# Patient Record
Sex: Male | Born: 1980
Health system: Southern US, Community
[De-identification: ages and names within clinical notes are randomized; demographics above are authoritative.]

## PROBLEM LIST (undated history)

## (undated) DIAGNOSIS — J449 Chronic obstructive pulmonary disease, unspecified: Secondary | ICD-10-CM

## (undated) DIAGNOSIS — J45909 Unspecified asthma, uncomplicated: Secondary | ICD-10-CM

## (undated) DIAGNOSIS — M545 Low back pain, unspecified: Secondary | ICD-10-CM

## (undated) DIAGNOSIS — B1920 Unspecified viral hepatitis C without hepatic coma: Secondary | ICD-10-CM

## (undated) DIAGNOSIS — F1990 Other psychoactive substance use, unspecified, uncomplicated: Secondary | ICD-10-CM

## (undated) DIAGNOSIS — K219 Gastro-esophageal reflux disease without esophagitis: Secondary | ICD-10-CM

## (undated) DIAGNOSIS — M1990 Unspecified osteoarthritis, unspecified site: Secondary | ICD-10-CM

## (undated) DIAGNOSIS — I509 Heart failure, unspecified: Secondary | ICD-10-CM

## (undated) DIAGNOSIS — G8929 Other chronic pain: Secondary | ICD-10-CM

## (undated) HISTORY — PX: TYMPANOSTOMY TUBE PLACEMENT: SHX32

## (undated) HISTORY — PX: HERNIA REPAIR: SHX51

## (undated) HISTORY — PX: ABDOMINAL HERNIA REPAIR: SHX539

## (undated) HISTORY — PX: TONSILLECTOMY AND ADENOIDECTOMY: SUR1326

---

## 2005-01-11 ENCOUNTER — Emergency Department: Payer: Self-pay | Admitting: Emergency Medicine

## 2007-10-10 ENCOUNTER — Emergency Department: Payer: Self-pay | Admitting: Emergency Medicine

## 2012-09-02 ENCOUNTER — Emergency Department: Payer: Self-pay | Admitting: Emergency Medicine

## 2016-01-11 ENCOUNTER — Encounter: Payer: Self-pay | Admitting: Emergency Medicine

## 2016-01-11 ENCOUNTER — Emergency Department
Admission: EM | Admit: 2016-01-11 | Discharge: 2016-01-11 | Disposition: A | Discharge status: Home / Self Care | Payer: Self-pay | Attending: Emergency Medicine | Admitting: Emergency Medicine

## 2016-01-11 DIAGNOSIS — F17210 Nicotine dependence, cigarettes, uncomplicated: Secondary | ICD-10-CM | POA: Insufficient documentation

## 2016-01-11 DIAGNOSIS — Y9389 Activity, other specified: Secondary | ICD-10-CM | POA: Insufficient documentation

## 2016-01-11 DIAGNOSIS — Y92002 Bathroom of unspecified non-institutional (private) residence single-family (private) house as the place of occurrence of the external cause: Secondary | ICD-10-CM | POA: Insufficient documentation

## 2016-01-11 DIAGNOSIS — T401X1A Poisoning by heroin, accidental (unintentional), initial encounter: Secondary | ICD-10-CM | POA: Insufficient documentation

## 2016-01-11 DIAGNOSIS — R4020 Unspecified coma: Secondary | ICD-10-CM | POA: Insufficient documentation

## 2016-01-11 DIAGNOSIS — T413X1A Poisoning by local anesthetics, accidental (unintentional), initial encounter: Secondary | ICD-10-CM | POA: Insufficient documentation

## 2016-01-11 DIAGNOSIS — Y998 Other external cause status: Secondary | ICD-10-CM | POA: Insufficient documentation

## 2016-01-11 LAB — CBC
HCT: 40.9 % (ref 40.0–52.0)
Hemoglobin: 13.8 g/dL (ref 13.0–18.0)
MCH: 30.1 pg (ref 26.0–34.0)
MCHC: 33.6 g/dL (ref 32.0–36.0)
MCV: 89.6 fL (ref 80.0–100.0)
Platelets: 202 10*3/uL (ref 150–440)
RBC: 4.57 MIL/uL (ref 4.40–5.90)
RDW: 14.1 % (ref 11.5–14.5)
WBC: 7.1 10*3/uL (ref 3.8–10.6)

## 2016-01-11 LAB — COMPREHENSIVE METABOLIC PANEL
ALBUMIN: 3.7 g/dL (ref 3.5–5.0)
ALT: 231 U/L — ABNORMAL HIGH (ref 17–63)
ANION GAP: 4 — AB (ref 5–15)
AST: 184 U/L — ABNORMAL HIGH (ref 15–41)
Alkaline Phosphatase: 96 U/L (ref 38–126)
BILIRUBIN TOTAL: 0.8 mg/dL (ref 0.3–1.2)
BUN: 9 mg/dL (ref 6–20)
CO2: 33 mmol/L — AB (ref 22–32)
Calcium: 8.4 mg/dL — ABNORMAL LOW (ref 8.9–10.3)
Chloride: 100 mmol/L — ABNORMAL LOW (ref 101–111)
Creatinine, Ser: 0.97 mg/dL (ref 0.61–1.24)
GFR calc Af Amer: >60 mL/min (ref >60)
GFR calc non Af Amer: >60 mL/min (ref >60)
GLUCOSE: 128 mg/dL — AB (ref 65–99)
POTASSIUM: 3.9 mmol/L (ref 3.5–5.1)
SODIUM: 137 mmol/L (ref 135–145)
TOTAL PROTEIN: 7.3 g/dL (ref 6.5–8.1)

## 2016-01-11 LAB — ACETAMINOPHEN LEVEL

## 2016-01-11 LAB — ETHANOL: Alcohol, Ethyl (B): <5 mg/dL (ref <5)

## 2016-01-11 LAB — SALICYLATE LEVEL: Salicylate Lvl: <4 mg/dL (ref 2.8–30.0)

## 2016-01-11 NOTE — [facility] Counselor (Signed)
TTS provided pt with SA Tx referrals to [facility]. Also, provided pt with a list of local substance abuse Tx facilities. Pt denied SI/HI.

## 2016-01-11 NOTE — ED Notes (Addendum)
BIB EMS from work [employer] workers found pt in the bathroom unresponsive. Syringe found near pt. Pt admits to using cocaine and heroin. Became more responsive with EMS enroute. Alert and oriented at this time. CBG 128. Tacycardia 110's. Pt states the overdoes was unintentional Denies SI/HI.

## 2016-01-11 NOTE — Discharge Instructions (Signed)
Accidental Overdose °A drug overdose occurs when a chemical substance (drug or medication) is used in amounts large enough to overcome a person. This may result in severe illness or death. This is a type of poisoning. Accidental overdoses of medications or other substances come from a variety of reasons. When this happens accidentally, it is often because the person taking the substance does not know enough about what they have taken. Drugs which commonly cause overdose deaths are alcohol, psychotropic medications (medications which affect the mind), pain medications, illegal drugs (street drugs) such as cocaine and heroin, and multiple drugs taken at the same time. It may result from careless behavior (such as over-indulging at a party). Other causes of overdose may include multiple drug use, a lapse in memory, or drug use after a period of no drug use.  °Sometimes overdosing occurs because a person cannot remember if they have taken their medication.  °A common unintentional overdose in young children involves multi-vitamins containing iron. Iron is a part of the hemoglobin molecule in blood. It is used to transport oxygen to living cells. When taken in small amounts, iron allows the body to restock hemoglobin. In large amounts, it causes problems in the body. If this overdose is not treated, it can lead to death. °Never take medicines that show signs of tampering or do not seem quite right. Never take medicines in the dark or in poor lighting. Read the label and check each dose of medicine before you take it. When adults are poisoned, it happens most often through carelessness or lack of information. Taking medicines in the dark or taking medicine prescribed for someone else to treat the same type of problem is a dangerous practice. °SYMPTOMS  °Symptoms of overdose depend on the medication and amount taken. They can vary from over-activity with stimulant over-dosage, to sleepiness from depressants such as  alcohol, narcotics and tranquilizers. Confusion, dizziness, nausea and vomiting may be present. If problems are severe enough coma and death may result. °DIAGNOSIS  °Diagnosis and management are generally straightforward if the drug is known. Otherwise it is more difficult. At times, certain symptoms and signs exhibited by the patient, or blood tests, can reveal the drug in question.  °TREATMENT  °In an emergency department, most patients can be treated with supportive measures. Antidotes may be available if there has been an overdose of opioids or benzodiazepines. A rapid improvement will often occur if this is the cause of overdose. °At home or away from medical care: °· There may be no immediate problems or warning signs in children. °· Not everything works well in all cases of poisoning. °· Take immediate action. Poisons may act quickly. °· If you think someone has swallowed medicine or a household product, and the person is unconscious, having seizures (convulsions), or is not breathing, immediately call for an ambulance. °IF a person is conscious and appears to be doing OK but has swallowed a poison: °· Do not wait to see what effect the poison will have. Immediately call a poison control center (listed in the white pages of your telephone book under "Poison Control" or inside the front cover with other emergency numbers). Some poison control centers have TTY capability for the deaf. Check with your local center if you or someone in your family requires this service. °· Keep the container so you can read the label on the product for ingredients. °· Describe what, when, and how much was taken and the age and condition of the person poisoned.   Inform them if the person is vomiting, choking, drowsy, shows a change in color or temperature of skin, is conscious or unconscious, or is convulsing. °· Do not cause vomiting unless instructed by medical personnel. Do not induce vomiting or force liquids into a person who  is convulsing, unconscious, or very drowsy. °Stay calm and in control.  °· Activated charcoal also is sometimes used in certain types of poisoning and you may wish to add a supply to your emergency medicines. It is available without a prescription. Call a poison control center before using this medication. °PREVENTION  °Thousands of children die every year from unintentional poisoning. This may be from household chemicals, poisoning from carbon monoxide in a car, taking their parent's medications, or simply taking a few iron pills or vitamins with iron. Poisoning comes from unexpected sources. °· Store medicines out of the sight and reach of children, preferably in a locked cabinet. Do not keep medications in a food cabinet. Always store your medicines in a secure place. Get rid of expired medications. °· If you have children living with you or have them as occasional guests, you should have child-resistant caps on your medicine containers. Keep everything out of reach. Child proof your home. °· If you are called to the telephone or to answer the door while you are taking a medicine, take the container with you or put the medicine out of the reach of small children. °· Do not take your medication in front of children. Do not tell your child how good a medication is and how good it is for them. They may get the idea it is more of a treat. °· If you are an adult and have accidentally taken an overdose, you need to consider how this happened and what can be done to prevent it from happening again. If this was from a street drug or alcohol, determine if there is a problem that needs addressing. If you are not sure a problems exists, it is easy to talk to a professional and ask them if they think you have a problem. It is better to handle this problem in this way before it happens again and has a much worse consequence. °  °This information is not intended to replace advice given to you by your health care provider. Make  sure you discuss any questions you have with your health care provider. °  °Document Released: 01/30/2005 Document Revised: 12/07/2014 Document Reviewed: 05/06/2015 °Elsevier Interactive Patient Education ©2016 [employer] ° °Narcotic Overdose °A narcotic overdose is the misuse or overuse of a narcotic drug. A narcotic overdose can make you pass out and stop breathing. If you are not treated right away, this can cause permanent brain damage or stop your heart. Medicine may be given to reverse the effects of an overdose. If so, this medicine may bring on withdrawal symptoms. The symptoms may be abdominal cramps, throwing up (vomiting), sweating, chills, and nervousness. °Injecting narcotics can cause more problems than just an overdose. AIDS, hepatitis, and other very serious infections are transmitted by sharing needles and syringes. If you decide to quit using, there are medicines which can help you through the withdrawal period. Trying to quit all at once on your own can be uncomfortable, but not life-threatening. Call your caregiver, Narcotics Anonymous, or any drug and alcohol treatment program for further help.  °  °This information is not intended to replace advice given to you by your health care provider. Make sure you discuss any questions   you have with your health care provider. °  °Document Released: 12/24/2004 Document Revised: 12/07/2014 Document Reviewed: 05/08/2015 °Elsevier Interactive Patient Education ©2016 [employer] ° °

## 2016-01-11 NOTE — ED Provider Notes (Signed)
[facility] Emergency Department Provider Note  Time seen: 10:25 AM  I have reviewed the triage vital signs and the nursing notes.   HISTORY  Chief Complaint Drug Overdose    HPI Aaron Young is a 35 y.o. male with no past medical history who presents the emergency department found unresponsive. According to the patient he states he was at work this morning, he went to the bathroom and injected cocaine and heroin. Patient was found by a coworker unresponsive on the bathroom floor. Patient brought to the emergency department. On the way to the emergency department the patient became awake, alert, upon arrival patient is oriented. Patient has not received Narcan. 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 States he has tried to detox himself with Suboxone. He is not interested in inpatient detox, but states he would like some information on outpatient detox. Denies any intentional harm. Denies any other substances.  No past medical history on file.  There are no active problems to display for this patient.   No past surgical history on file.  No current outpatient prescriptions on file.  Allergies Review of patient's allergies indicates no known allergies.  No family history on file.  Social History Social History  Substance Use Topics  . Smoking status: Current Every Day Smoker -- 1.00 packs/day    Types: Cigarettes  . Smokeless tobacco: Never Used  . Alcohol Use: No    Review of Systems Constitutional: Negative for fever Cardiovascular: Negative for chest pain. Respiratory: Negative for shortness of breath. Gastrointestinal: Negative for abdominal pain Neurological: Negative for headache 10-point ROS otherwise negative.  ____________________________________________   PHYSICAL EXAM:  VITAL SIGNS: ED Triage Vitals  Enc Vitals Group     BP 01/11/16 1017 156/85 mmHg     Pulse Rate 01/11/16 1017 126   Resp 01/11/16 1017 22     Temp 01/11/16 1017 98.9 F (37.2 C)     Temp Source 01/11/16 1017 Oral     SpO2 01/11/16 1017 91 %     Weight --      Height --      Head Cir --      Peak Flow --      Pain Score 01/11/16 1021 8     Pain Loc --      Pain Edu? --      Excl. in GC? --     Constitutional: Alert and oriented. Well appearing and in no distress. Eyes: Normal exam ENT   Head: Normocephalic and atraumatic.   Mouth/Throat: Mucous membranes are moist. Cardiovascular: Normal rate, regular rhythm. Respiratory: Normal respiratory effort without tachypnea nor retractions. Breath sounds are clear Gastrointestinal: Soft and nontender. No distention. Musculoskeletal: Nontender with normal range of motion in all extremities.  Neurologic:  Normal speech and language. No gross focal neurologic deficits Skin:  Skin is warm, dry and intact.  Psychiatric: Mood and affect are normal. Speech and behavior are normal.  ____________________________________________    INITIAL IMPRESSION / ASSESSMENT AND PLAN / ED COURSE  Pertinent labs & imaging results that were available during my care of the patient were reviewed by me and considered in my medical decision making (see chart for details).  Patient presents the emergency department after accidental heroin overdose. Patient is awake, alert, oriented, no complaints at this time. Patient has not received Narcan. We will check labs, monitor the patient in the emergency department. Patient is not interested in inpatient detox.  She continues to appear well, labs are largely within normal limits besides mildly elevated LFTs which the patient is aware of, states they have been like that for several years. Patient has eaten, is awake alert oriented, no distress has no complaints. We'll discharge home.  ____________________________________________   FINAL CLINICAL IMPRESSION(S) / ED DIAGNOSES  Heroin overdose, accidental   Minna Antis, MD 01/11/16 1328

## 2016-01-11 NOTE — ED Notes (Signed)
Pt sitting up eating lunch. Given phone return call to brother MAC.

## 2016-01-13 ENCOUNTER — Emergency Department
Admission: EM | Admit: 2016-01-13 | Discharge: 2016-01-13 | Disposition: A | Discharge status: Home / Self Care | Payer: Self-pay | Attending: Emergency Medicine | Admitting: Emergency Medicine

## 2016-01-13 ENCOUNTER — Encounter: Payer: Self-pay | Admitting: *Deleted

## 2016-01-13 ENCOUNTER — Emergency Department: Payer: Self-pay

## 2016-01-13 DIAGNOSIS — F17210 Nicotine dependence, cigarettes, uncomplicated: Secondary | ICD-10-CM | POA: Insufficient documentation

## 2016-01-13 DIAGNOSIS — X58XXXA Exposure to other specified factors, initial encounter: Secondary | ICD-10-CM | POA: Insufficient documentation

## 2016-01-13 DIAGNOSIS — S92902A Unspecified fracture of left foot, initial encounter for closed fracture: Secondary | ICD-10-CM

## 2016-01-13 DIAGNOSIS — S92322A Displaced fracture of second metatarsal bone, left foot, initial encounter for closed fracture: Secondary | ICD-10-CM | POA: Insufficient documentation

## 2016-01-13 DIAGNOSIS — Y92008 Other place in unspecified non-institutional (private) residence as the place of occurrence of the external cause: Secondary | ICD-10-CM | POA: Insufficient documentation

## 2016-01-13 DIAGNOSIS — Y998 Other external cause status: Secondary | ICD-10-CM | POA: Insufficient documentation

## 2016-01-13 DIAGNOSIS — Y9339 Activity, other involving climbing, rappelling and jumping off: Secondary | ICD-10-CM | POA: Insufficient documentation

## 2016-01-13 HISTORY — DX: Unspecified asthma, uncomplicated: J45.909

## 2016-01-13 MED ORDER — OXYCODONE-ACETAMINOPHEN 5-325 MG PO TABS
1.0000 | ORAL_TABLET | Freq: Once | ORAL | Status: AC
Start: 2016-01-13 — End: 2016-01-13
  Administered 2016-01-13: 1 via ORAL
  Filled 2016-01-13: qty 1

## 2016-01-13 MED ORDER — IBUPROFEN 600 MG PO TABS: 600.0000 mg | ORAL_TABLET | Freq: Four times a day (QID) | ORAL | Status: DC | PRN

## 2016-01-13 MED ORDER — IBUPROFEN 600 MG PO TABS
600.0000 mg | ORAL_TABLET | Freq: Once | ORAL | Status: AC
  Administered 2016-01-13: 600 mg via ORAL
  Filled 2016-01-13: qty 1

## 2016-01-13 MED ORDER — OXYCODONE-ACETAMINOPHEN 7.5-325 MG PO TABS: 1.0000 | ORAL_TABLET | Freq: Four times a day (QID) | ORAL | Status: DC | PRN

## 2016-01-13 NOTE — Discharge Instructions (Signed)
Ambulate with crutches until evaluation by podiatry.

## 2016-01-13 NOTE — ED Notes (Signed)
Pt states he jumped off his porch last night and heard a "crunch", states left foot pain

## 2016-01-13 NOTE — ED Notes (Signed)
Pt in via triage with complaints of left foot pain; pt reports jumping off porch last night and hearing a "crunch" in left foot when he landed.  Pt reports keeping foot elevated last night but pain/swelling were no better this morning.  Pt ambulatory with crutches

## 2016-01-13 NOTE — ED Provider Notes (Signed)
[facility] Emergency Department Provider Note  ____________________________________________  Time seen: Approximately 1:33 PM  I have reviewed the triage vital signs and the nursing notes.   HISTORY  Chief Complaint Foot Pain    HPI Aaron Young is a 35 y.o. male patient complaining of left foot pain status post jumping off the porch last night. Patient state he heard a crunch and foot when he landed. Patient stated pain in his foot has increased overnight. This also increased swelling. Patient is taking over-the-counter ibuprofen with no relief. Patient rates his pain as a 10 over 10.No other palliative measures taken for this complaint.   Past Medical History  Diagnosis Date  . Asthma     There are no active problems to display for this patient.   History reviewed. No pertinent past surgical history.  No current outpatient prescriptions on file.  Allergies Review of patient's allergies indicates no known allergies.  History reviewed. No pertinent family history.  Social History Social History  Substance Use Topics  . Smoking status: Current Every Day Smoker -- 1.00 packs/day    Types: Cigarettes  . Smokeless tobacco: Never Used  . Alcohol Use: No    Review of Systems Constitutional: No fever/chills Eyes: No visual changes. ENT: No sore throat. Cardiovascular: Denies chest pain. Respiratory: Denies shortness of breath. Gastrointestinal: No abdominal pain.  No nausea, no vomiting.  No diarrhea.  No constipation. Genitourinary: Negative for dysuria. Musculoskeletal: Left foot pain Skin: Negative for rash. Neurological: Negative for headaches, focal weakness or numbness.    ____________________________________________   PHYSICAL EXAM:  VITAL SIGNS: ED Triage Vitals  Enc Vitals Group     BP 01/13/16 1158 150/69 mmHg     Pulse Rate 01/13/16 1158 99     Resp 01/13/16 1158 18     Temp 01/13/16 1158 98.2 F (36.8 C)      Temp Source 01/13/16 1158 Oral     SpO2 01/13/16 1158 95 %     Weight 01/13/16 1158 175 lb (79.379 kg)     Height 01/13/16 1158  (1.778 m)     Head Cir --      Peak Flow --      Pain Score 01/13/16 1159 10     Pain Loc --      Pain Edu? --      Excl. in GC? --     Constitutional: Alert and oriented. Well appearing and in no acute distress. Eyes: Conjunctivae are normal. PERRL. EOMI. Head: Atraumatic. Nose: No congestion/rhinnorhea. Mouth/Throat: Mucous membranes are moist.  Oropharynx non-erythematous. Neck: No stridor. No cervical spine tenderness to palpation. Hematological/Lymphatic/Immunilogical: No cervical lymphadenopathy. Cardiovascular: Normal rate, regular rhythm. Grossly normal heart sounds.  Good peripheral circulation. Respiratory: Normal respiratory effort.  No retractions. Lungs CTAB. Gastrointestinal: Soft and nontender. No distention. No abdominal bruits. No CVA tenderness. Musculoskeletal: Obviously edema but no deformity of the left foot. Tender palpation dorsal aspect of the first and second metatarsal. Patient cannot weight-bear.  Neurologic:  Normal speech and language. No gross focal neurologic deficits are appreciated. No gait instability. Skin:  Skin is warm, dry and intact. No rash noted. Psychiatric: Mood and affect are normal. Speech and behavior are normal.  ____________________________________________   LABS (all labs ordered are listed, but only abnormal results are displayed)  Labs Reviewed - No data to display ____________________________________________  EKG   ____________________________________________  RADIOLOGY I, Joni Reining, personally viewed and evaluated these images (plain radiographs) as part of  my medical decision making, as well as reviewing the written report by the radiologist.  ____________________________________________   PROCEDURES  Procedure(s) performed: None  Critical Care performed:  No  ____________________________________________   INITIAL IMPRESSION / ASSESSMENT AND PLAN / ED COURSE  Pertinent labs & imaging results that were available during my care of the patient were reviewed by me and considered in my medical decision making (see chart for details).  Discussed x-ray findings the patient's chart fracture the first and second metatarsal. Patient given a prescription of ibuprofen and Percocets. Patient advised to ambulate with crutches. Patient advised contact orthopedic department the morning for follow-up for definitive care.. ____________________________________________   FINAL CLINICAL IMPRESSION(S) / ED DIAGNOSES  Final diagnoses:  Foot fracture, left, closed, initial encounter      Joni Reining, PA-C 01/13/16 1340  Rockne Menghini, MD 01/13/16 1529

## 2016-02-07 ENCOUNTER — Ambulatory Visit
Admission: RE | Admit: 2016-02-07 | Discharge: 2016-02-07 | Disposition: A | Discharge status: Home / Self Care | Payer: Self-pay | Source: Ambulatory Visit | Attending: Podiatry | Admitting: Podiatry

## 2016-02-07 ENCOUNTER — Other Ambulatory Visit: Payer: Self-pay | Admitting: Podiatry

## 2016-02-07 DIAGNOSIS — I82402 Acute embolism and thrombosis of unspecified deep veins of left lower extremity: Secondary | ICD-10-CM

## 2016-02-07 DIAGNOSIS — M79605 Pain in left leg: Secondary | ICD-10-CM | POA: Insufficient documentation

## 2017-10-03 ENCOUNTER — Emergency Department
Admission: EM | Admit: 2017-10-03 | Discharge: 2017-10-03 | Disposition: A | Discharge status: Home / Self Care | Payer: Self-pay | Attending: Emergency Medicine | Admitting: Emergency Medicine

## 2017-10-03 DIAGNOSIS — F17210 Nicotine dependence, cigarettes, uncomplicated: Secondary | ICD-10-CM | POA: Insufficient documentation

## 2017-10-03 DIAGNOSIS — L02414 Cutaneous abscess of left upper limb: Secondary | ICD-10-CM | POA: Insufficient documentation

## 2017-10-03 DIAGNOSIS — J45909 Unspecified asthma, uncomplicated: Secondary | ICD-10-CM | POA: Insufficient documentation

## 2017-10-03 DIAGNOSIS — L0291 Cutaneous abscess, unspecified: Secondary | ICD-10-CM

## 2017-10-03 LAB — BASIC METABOLIC PANEL
ANION GAP: 9 (ref 5–15)
BUN: 8 mg/dL (ref 6–20)
CHLORIDE: 100 mmol/L — AB (ref 101–111)
CO2: 28 mmol/L (ref 22–32)
Calcium: 9 mg/dL (ref 8.9–10.3)
Creatinine, Ser: 0.84 mg/dL (ref 0.61–1.24)
GFR calc Af Amer: >60 mL/min (ref >60)
GFR calc non Af Amer: >60 mL/min (ref >60)
Glucose, Bld: 94 mg/dL (ref 65–99)
POTASSIUM: 4.2 mmol/L (ref 3.5–5.1)
SODIUM: 137 mmol/L (ref 135–145)

## 2017-10-03 LAB — CBC
HEMATOCRIT: 42 % (ref 40.0–52.0)
HEMOGLOBIN: 14.3 g/dL (ref 13.0–18.0)
MCH: 30.3 pg (ref 26.0–34.0)
MCHC: 34 g/dL (ref 32.0–36.0)
MCV: 89.1 fL (ref 80.0–100.0)
Platelets: 311 10*3/uL (ref 150–440)
RBC: 4.72 MIL/uL (ref 4.40–5.90)
RDW: 13.5 % (ref 11.5–14.5)
WBC: 6 10*3/uL (ref 3.8–10.6)

## 2017-10-03 MED ORDER — LIDOCAINE-EPINEPHRINE 2 %-1:100000 IJ SOLN
30.0000 mL | Freq: Once | INTRAMUSCULAR | Status: AC
Start: 2017-10-03 — End: 2017-10-03
  Administered 2017-10-03: 30 mL via INTRADERMAL
  Filled 2017-10-03: qty 30

## 2017-10-03 MED ORDER — CLINDAMYCIN HCL 300 MG PO CAPS: 300.0000 mg | ORAL_CAPSULE | Freq: Three times a day (TID) | ORAL | 0 refills | Status: DC

## 2017-10-03 MED ORDER — CLINDAMYCIN HCL 150 MG PO CAPS
300.0000 mg | ORAL_CAPSULE | Freq: Once | ORAL | Status: AC
  Administered 2017-10-03: 300 mg via ORAL
  Filled 2017-10-03: qty 2

## 2017-10-03 NOTE — ED Notes (Signed)
Pt noted to be sleeping while waiting for treatment room; significant other awake beside him

## 2017-10-03 NOTE — ED Notes (Signed)
Pt noted to be sleeping in waiting room while waiting for treatment room

## 2017-10-03 NOTE — ED Notes (Signed)
Pt triaged on paper during downtime  

## 2017-10-03 NOTE — Discharge Instructions (Signed)
You are evaluated for abscess to the left arm, which was open to promote drainage of the infection.  You were started on antibiotic clindamycin, please complete the entire course.  Given the size of the abscess, I would recommend having this rechecked in 2 days, certainly if it is not improving.  Return to the emergency department immediately for any new or worsening rash, redness, pain, or any fever, headache, nausea, or systemic symptoms, or any other symptoms concerning to you.

## 2017-10-03 NOTE — ED Provider Notes (Signed)
[facility] Emergency Department Provider Note ____________________________________________   I have reviewed the triage vital signs and the triage nursing note.  HISTORY  Chief Complaint Abscess   Historian Patient  HPI Aaron Young is a 36 y.o. male no prior history of cellulitis or abscess presents with abscess on his left forearm.  Patient states that about a week ago.  He did have leftover sulfa antibiotic and started taking 3 tablets/day for about 4-5 days before he ran out.  At some point during the last several days the boil had opened and drained some and he tried to express it further.  It has improved quite a bit, but still swollen.  Girlfriend requested he come in for evaluation.  Pain is moderate.  Nothing makes worse or better.   Past Medical History:  Diagnosis Date  . Asthma     There are no active problems to display for this patient.   No past surgical history on file.  Prior to Admission medications   Medication Sig Start Date End Date Taking? Authorizing Provider  clindamycin (CLEOCIN) 300 MG capsule Take 1 capsule (300 mg total) 3 (three) times daily by mouth. 10/03/17   Governor Rooks, MD  ibuprofen (ADVIL,MOTRIN) 600 MG tablet Take 1 tablet (600 mg total) by mouth every 6 (six) hours as needed. 01/13/16   Joni Reining, PA-C  oxyCODONE-acetaminophen (PERCOCET) 7.5-325 MG tablet Take 1 tablet by mouth every 6 (six) hours as needed for severe pain. 01/13/16   Joni Reining, PA-C    No Known Allergies  No family history on file.  Social History Social History   Tobacco Use  . Smoking status: Current Every Day Smoker    Packs/day: 1.00    Types: Cigarettes  . Smokeless tobacco: Never Used  Substance Use Topics  . Alcohol use: No  . Drug use: Yes    Types: Cocaine    Review of Systems  Constitutional: Negative for fever. Eyes: Negative for visual changes. ENT: Negative for sore throat. Cardiovascular:  Negative for chest pain. Respiratory: Negative for shortness of breath. Gastrointestinal: Negative for abdominal pain, vomiting and diarrhea. Genitourinary: Negative for dysuria. Musculoskeletal: Negative for back pain. Skin: Left forearm swelling redness and boil. Neurological: Negative for headache.  ____________________________________________   PHYSICAL EXAM:  VITAL SIGNS: ED Triage Vitals  Enc Vitals Group     BP      Pulse      Resp      Temp      Temp src      SpO2      Weight      Height      Head Circumference      Peak Flow      Pain Score      Pain Loc      Pain Edu?      Excl. in GC?      Constitutional: Alert and oriented. Well appearing and in no distress. HEENT   Head: Normocephalic and atraumatic.      Eyes: Conjunctivae are normal. Pupils equal and round.       Ears:         Nose: No congestion/rhinnorhea.   Mouth/Throat: Mucous membranes are moist.   Neck: No stridor. Cardiovascular/Chest: Strong peripheral pulses.   Respiratory: Normal respiratory effort without tachypnea nor retractions.  Gastrointestinal: No distention. Genitourinary/rectal:Deferred Musculoskeletal: Pain at left forearm where there is an abscess. Neurologic:  Normal speech and language. No gross or focal neurologic  deficits are appreciated. Skin: Left forearm volar surface abscess with pointing and some scabbing, mild surrounding redness. Psychiatric: Mood and affect are normal. Speech and behavior are normal. Patient exhibits appropriate insight and judgment.   ____________________________________________  LABS (pertinent positives/negatives) I, Governor Rooks, MD the attending physician have reviewed the labs noted below.  Labs Reviewed  BASIC METABOLIC PANEL - Abnormal; Notable for the following components:      Result Value   Chloride 100 (*)    All other components within normal limits  AEROBIC CULTURE (SUPERFICIAL SPECIMEN)  CBC     ____________________________________________    EKG I, Governor Rooks, MD, the attending physician have personally viewed and interpreted all ECGs.  None ____________________________________________  RADIOLOGY All Xrays were viewed by me.  Imaging interpreted by Radiologist, and I, Governor Rooks, MD the attending physician have reviewed the radiologist interpretation noted below.  None __________________________________________  PROCEDURES  Procedure(s) performed: INCISION AND DRAINAGE Performed by: Governor Rooks Consent: Verbal consent obtained. Risks and benefits: risks, benefits and alternatives were discussed Type: abscess  Body area: left forearm  Anesthesia: local infiltration  Incision was made with a scalpel.  Local anesthetic: lidocaine 2% with epinephrine  Anesthetic total: 1 ml   Drainage: purulent  Drainage amount: minimal    Patient tolerance: Patient tolerated the procedure well with no immediate complications.     Critical Care performed: None  ____________________________________________  No current facility-administered medications on file prior to encounter.    Current Outpatient Medications on File Prior to Encounter  Medication Sig Dispense Refill  . ibuprofen (ADVIL,MOTRIN) 600 MG tablet Take 1 tablet (600 mg total) by mouth every 6 (six) hours as needed. 30 tablet 0  . oxyCODONE-acetaminophen (PERCOCET) 7.5-325 MG tablet Take 1 tablet by mouth every 6 (six) hours as needed for severe pain. 12 tablet 0    ____________________________________________  ED COURSE / ASSESSMENT AND PLAN  Pertinent labs & imaging results that were available during my care of the patient were reviewed by me and considered in my medical decision making (see chart for details).    Consistent with abscess, small amount of surrounding cellulitis.  I am concerned about potentially resistant to Bactrim given patient reporting 4 days of leftover sulfa  antibiotic use.  Given that this did not heal after the patient reports it drained at home, I do think he requires antibiotic after I&D.  No evidence of sepsis at this point.  Patient understands clear return precautions.  DIFFERENTIAL DIAGNOSIS: Including but not limited to cellulitis, abscess, sepsis, trauma, compartment syndrome, etc.  CONSULTATIONS:   None   Patient / Family / Caregiver informed of clinical course, medical decision-making process, and agree with plan.   I discussed return precautions, follow-up instructions, and discharge instructions with patient and/or family.  Discharge Instructions : You are evaluated for abscess to the left arm, which was open to promote drainage of the infection.  You were started on antibiotic clindamycin, please complete the entire course.  Given the size of the abscess, I would recommend having this rechecked in 2 days, certainly if it is not improving.  Return to the emergency department immediately for any new or worsening rash, redness, pain, or any fever, headache, nausea, or systemic symptoms, or any other symptoms concerning to you.  ___________________________________________   FINAL CLINICAL IMPRESSION(S) / ED DIAGNOSES   Final diagnoses:  Abscess              Note: This dictation was prepared with Dragon dictation. Any  transcriptional errors that result from this process are unintentional    Governor RooksLord, Aaron Dishman, MD 10/03/17 [phone]

## 2017-10-06 LAB — AEROBIC CULTURE  (SUPERFICIAL SPECIMEN)

## 2017-10-06 LAB — AEROBIC CULTURE W GRAM STAIN (SUPERFICIAL SPECIMEN)

## 2018-02-12 DIAGNOSIS — J45909 Unspecified asthma, uncomplicated: Secondary | ICD-10-CM | POA: Diagnosis not present

## 2018-02-12 DIAGNOSIS — S4992XA Unspecified injury of left shoulder and upper arm, initial encounter: Secondary | ICD-10-CM | POA: Diagnosis present

## 2018-02-12 DIAGNOSIS — Y92410 Unspecified street and highway as the place of occurrence of the external cause: Secondary | ICD-10-CM | POA: Insufficient documentation

## 2018-02-12 DIAGNOSIS — Y9389 Activity, other specified: Secondary | ICD-10-CM | POA: Insufficient documentation

## 2018-02-12 DIAGNOSIS — Z79899 Other long term (current) drug therapy: Secondary | ICD-10-CM | POA: Diagnosis not present

## 2018-02-12 DIAGNOSIS — S46012A Strain of muscle(s) and tendon(s) of the rotator cuff of left shoulder, initial encounter: Secondary | ICD-10-CM | POA: Insufficient documentation

## 2018-02-12 DIAGNOSIS — F17210 Nicotine dependence, cigarettes, uncomplicated: Secondary | ICD-10-CM | POA: Diagnosis not present

## 2018-02-12 DIAGNOSIS — Y999 Unspecified external cause status: Secondary | ICD-10-CM | POA: Insufficient documentation

## 2018-02-12 DIAGNOSIS — R2243 Localized swelling, mass and lump, lower limb, bilateral: Secondary | ICD-10-CM | POA: Insufficient documentation

## 2018-02-13 ENCOUNTER — Other Ambulatory Visit: Payer: Self-pay

## 2018-02-13 ENCOUNTER — Emergency Department: Payer: No Typology Code available for payment source

## 2018-02-13 ENCOUNTER — Emergency Department
Admission: EM | Admit: 2018-02-13 | Discharge: 2018-02-13 | Disposition: A | Discharge status: Home / Self Care | Payer: No Typology Code available for payment source | Attending: Emergency Medicine | Admitting: Emergency Medicine

## 2018-02-13 ENCOUNTER — Encounter: Payer: Self-pay | Admitting: Emergency Medicine

## 2018-02-13 DIAGNOSIS — M7989 Other specified soft tissue disorders: Secondary | ICD-10-CM

## 2018-02-13 DIAGNOSIS — R52 Pain, unspecified: Secondary | ICD-10-CM

## 2018-02-13 DIAGNOSIS — T148XXA Other injury of unspecified body region, initial encounter: Secondary | ICD-10-CM

## 2018-02-13 LAB — CBC WITH DIFFERENTIAL/PLATELET
BASOS ABS: 0.1 10*3/uL (ref 0–0.1)
Basophils Relative: 1 %
Eosinophils Absolute: 0.2 10*3/uL (ref 0–0.7)
Eosinophils Relative: 3 %
HCT: 42.4 % (ref 40.0–52.0)
Hemoglobin: 14.7 g/dL (ref 13.0–18.0)
LYMPHS PCT: 33 %
Lymphs Abs: 2.4 10*3/uL (ref 1.0–3.6)
MCH: 30.7 pg (ref 26.0–34.0)
MCHC: 34.7 g/dL (ref 32.0–36.0)
MCV: 88.6 fL (ref 80.0–100.0)
MONO ABS: 1 10*3/uL (ref 0.2–1.0)
Monocytes Relative: 14 %
Neutro Abs: 3.8 10*3/uL (ref 1.4–6.5)
Neutrophils Relative %: 49 %
Platelets: 275 10*3/uL (ref 150–440)
RBC: 4.79 MIL/uL (ref 4.40–5.90)
RDW: 13 % (ref 11.5–14.5)
WBC: 7.5 10*3/uL (ref 3.8–10.6)

## 2018-02-13 LAB — URINALYSIS, COMPLETE (UACMP) WITH MICROSCOPIC
BACTERIA UA: NONE SEEN
Bilirubin Urine: NEGATIVE
Glucose, UA: NEGATIVE mg/dL
Hgb urine dipstick: NEGATIVE
Ketones, ur: NEGATIVE mg/dL
Leukocytes, UA: NEGATIVE
Nitrite: NEGATIVE
PH: 6 (ref 5.0–8.0)
Protein, ur: NEGATIVE mg/dL
SPECIFIC GRAVITY, URINE: 1.01 (ref 1.005–1.030)
SQUAMOUS EPITHELIAL / LPF: NONE SEEN

## 2018-02-13 LAB — COMPREHENSIVE METABOLIC PANEL
ALT: 66 U/L — AB (ref 17–63)
AST: 54 U/L — AB (ref 15–41)
Albumin: 3.9 g/dL (ref 3.5–5.0)
Alkaline Phosphatase: 90 U/L (ref 38–126)
Anion gap: 11 (ref 5–15)
BUN: 13 mg/dL (ref 6–20)
CO2: 31 mmol/L (ref 22–32)
CREATININE: 1.21 mg/dL (ref 0.61–1.24)
Calcium: 8.8 mg/dL — ABNORMAL LOW (ref 8.9–10.3)
Chloride: 93 mmol/L — ABNORMAL LOW (ref 101–111)
GFR calc Af Amer: >60 mL/min (ref >60)
Glucose, Bld: 116 mg/dL — ABNORMAL HIGH (ref 65–99)
POTASSIUM: 2.9 mmol/L — AB (ref 3.5–5.1)
Sodium: 135 mmol/L (ref 135–145)
TOTAL PROTEIN: 8.4 g/dL — AB (ref 6.5–8.1)
Total Bilirubin: 0.6 mg/dL (ref 0.3–1.2)

## 2018-02-13 LAB — TSH: TSH: 1.848 u[IU]/mL (ref 0.350–4.500)

## 2018-02-13 LAB — BRAIN NATRIURETIC PEPTIDE: B Natriuretic Peptide: 23 pg/mL (ref 0.0–100.0)

## 2018-02-13 LAB — TROPONIN I

## 2018-02-13 MED ORDER — ACETAMINOPHEN 500 MG PO TABS
1000.0000 mg | ORAL_TABLET | Freq: Once | ORAL | Status: AC
  Administered 2018-02-13: 1000 mg via ORAL
  Filled 2018-02-13: qty 2

## 2018-02-13 MED ORDER — IBUPROFEN 600 MG PO TABS
600.0000 mg | ORAL_TABLET | Freq: Once | ORAL | Status: AC
  Administered 2018-02-13: 600 mg via ORAL
  Filled 2018-02-13: qty 1

## 2018-02-13 MED ORDER — IBUPROFEN 600 MG PO TABS: 600.0000 mg | ORAL_TABLET | Freq: Three times a day (TID) | ORAL | 0 refills | Status: DC | PRN

## 2018-02-13 NOTE — ED Notes (Signed)
ED Provider at bedside. 
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
## 2018-11-06 ENCOUNTER — Encounter (HOSPITAL_COMMUNITY): Payer: Self-pay

## 2018-11-06 ENCOUNTER — Inpatient Hospital Stay (HOSPITAL_COMMUNITY)
Admission: EM | Admit: 2018-11-06 | Discharge: 2018-11-10 | DRG: 956 | Disposition: A | Discharge status: Home Health | Payer: Medicaid Other | Attending: Orthopaedic Surgery | Admitting: Orthopaedic Surgery

## 2018-11-06 DIAGNOSIS — S7292XA Unspecified fracture of left femur, initial encounter for closed fracture: Secondary | ICD-10-CM | POA: Diagnosis present

## 2018-11-06 DIAGNOSIS — F17200 Nicotine dependence, unspecified, uncomplicated: Secondary | ICD-10-CM | POA: Diagnosis present

## 2018-11-06 DIAGNOSIS — Y92488 Other paved roadways as the place of occurrence of the external cause: Secondary | ICD-10-CM

## 2018-11-06 DIAGNOSIS — S72352G Displaced comminuted fracture of shaft of left femur, subsequent encounter for closed fracture with delayed healing: Secondary | ICD-10-CM

## 2018-11-06 DIAGNOSIS — S72352A Displaced comminuted fracture of shaft of left femur, initial encounter for closed fracture: Principal | ICD-10-CM | POA: Diagnosis present

## 2018-11-06 DIAGNOSIS — S36031A Moderate laceration of spleen, initial encounter: Secondary | ICD-10-CM | POA: Diagnosis present

## 2018-11-06 DIAGNOSIS — S36039A Unspecified laceration of spleen, initial encounter: Secondary | ICD-10-CM

## 2018-11-06 DIAGNOSIS — S7290XA Unspecified fracture of unspecified femur, initial encounter for closed fracture: Secondary | ICD-10-CM | POA: Diagnosis present

## 2018-11-06 DIAGNOSIS — J449 Chronic obstructive pulmonary disease, unspecified: Secondary | ICD-10-CM | POA: Diagnosis present

## 2018-11-06 DIAGNOSIS — Z419 Encounter for procedure for purposes other than remedying health state, unspecified: Secondary | ICD-10-CM

## 2018-11-06 HISTORY — DX: Chronic obstructive pulmonary disease, unspecified: J44.9

## 2018-11-06 HISTORY — DX: Unspecified osteoarthritis, unspecified site: M19.90

## 2018-11-06 HISTORY — DX: Gastro-esophageal reflux disease without esophagitis: K21.9

## 2018-11-06 HISTORY — DX: Other psychoactive substance use, unspecified, uncomplicated: F19.90

## 2018-11-06 HISTORY — DX: Heart failure, unspecified: I50.9

## 2018-11-06 HISTORY — DX: Low back pain, unspecified: M54.50

## 2018-11-06 HISTORY — DX: Other chronic pain: G89.29

## 2018-11-06 HISTORY — DX: Unspecified viral hepatitis C without hepatic coma: B19.20

## 2018-11-06 HISTORY — DX: Low back pain: M54.5
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
## 2018-11-06 NOTE — ED Notes (Signed)
Unable to obtain IV

## 2018-11-07 ENCOUNTER — Encounter (HOSPITAL_COMMUNITY): Payer: Self-pay

## 2018-11-07 ENCOUNTER — Other Ambulatory Visit (HOSPITAL_COMMUNITY): Payer: Self-pay

## 2018-11-07 ENCOUNTER — Emergency Department (HOSPITAL_COMMUNITY): Payer: Medicaid Other

## 2018-11-07 ENCOUNTER — Encounter (HOSPITAL_COMMUNITY): Admission: EM | Disposition: A | Discharge status: Home Health | Payer: Self-pay | Source: Home / Self Care

## 2018-11-07 ENCOUNTER — Inpatient Hospital Stay (HOSPITAL_COMMUNITY): Payer: Medicaid Other | Admitting: Anesthesiology

## 2018-11-07 ENCOUNTER — Inpatient Hospital Stay (HOSPITAL_COMMUNITY): Payer: Medicaid Other

## 2018-11-07 DIAGNOSIS — S72352A Displaced comminuted fracture of shaft of left femur, initial encounter for closed fracture: Secondary | ICD-10-CM | POA: Diagnosis present

## 2018-11-07 DIAGNOSIS — S7290XA Unspecified fracture of unspecified femur, initial encounter for closed fracture: Secondary | ICD-10-CM | POA: Diagnosis present

## 2018-11-07 DIAGNOSIS — F17200 Nicotine dependence, unspecified, uncomplicated: Secondary | ICD-10-CM | POA: Diagnosis present

## 2018-11-07 DIAGNOSIS — S36031A Moderate laceration of spleen, initial encounter: Secondary | ICD-10-CM | POA: Diagnosis present

## 2018-11-07 DIAGNOSIS — Y92488 Other paved roadways as the place of occurrence of the external cause: Secondary | ICD-10-CM | POA: Diagnosis not present

## 2018-11-07 DIAGNOSIS — J449 Chronic obstructive pulmonary disease, unspecified: Secondary | ICD-10-CM | POA: Diagnosis present

## 2018-11-07 DIAGNOSIS — S7292XA Unspecified fracture of left femur, initial encounter for closed fracture: Secondary | ICD-10-CM | POA: Diagnosis present

## 2018-11-07 DIAGNOSIS — M25552 Pain in left hip: Secondary | ICD-10-CM | POA: Diagnosis present

## 2018-11-07 HISTORY — PX: FEMUR FRACTURE SURGERY: SHX633

## 2018-11-07 HISTORY — PX: FEMUR IM NAIL: SHX1597

## 2018-11-07 LAB — COMPREHENSIVE METABOLIC PANEL
ALT: 89 U/L — ABNORMAL HIGH (ref 0–44)
AST: 90 U/L — ABNORMAL HIGH (ref 15–41)
Albumin: 3 g/dL — ABNORMAL LOW (ref 3.5–5.0)
Alkaline Phosphatase: 75 U/L (ref 38–126)
Anion gap: 9 (ref 5–15)
BUN: 11 mg/dL (ref 6–20)
CHLORIDE: 99 mmol/L (ref 98–111)
CO2: 26 mmol/L (ref 22–32)
Calcium: 8.4 mg/dL — ABNORMAL LOW (ref 8.9–10.3)
Creatinine, Ser: 1.05 mg/dL (ref 0.61–1.24)
GFR calc Af Amer: >60 mL/min (ref >60)
GFR calc non Af Amer: >60 mL/min (ref >60)
Glucose, Bld: 103 mg/dL — ABNORMAL HIGH (ref 70–99)
Potassium: 3.6 mmol/L (ref 3.5–5.1)
Sodium: 134 mmol/L — ABNORMAL LOW (ref 135–145)
TOTAL PROTEIN: 7.1 g/dL (ref 6.5–8.1)
Total Bilirubin: 0.9 mg/dL (ref 0.3–1.2)

## 2018-11-07 LAB — CBC
HCT: 37.7 % — ABNORMAL LOW (ref 39.0–52.0)
HEMATOCRIT: 28.8 % — AB (ref 39.0–52.0)
HEMATOCRIT: 26.7 % — AB (ref 39.0–52.0)
Hemoglobin: 12.5 g/dL — ABNORMAL LOW (ref 13.0–17.0)
Hemoglobin: 8.8 g/dL — ABNORMAL LOW (ref 13.0–17.0)
Hemoglobin: 9.4 g/dL — ABNORMAL LOW (ref 13.0–17.0)
MCH: 29.2 pg (ref 26.0–34.0)
MCH: 29.4 pg (ref 26.0–34.0)
MCH: 29.5 pg (ref 26.0–34.0)
MCHC: 32.6 g/dL (ref 30.0–36.0)
MCHC: 33 g/dL (ref 30.0–36.0)
MCHC: 33.2 g/dL (ref 30.0–36.0)
MCV: 88.7 fL (ref 80.0–100.0)
MCV: 88.9 fL (ref 80.0–100.0)
MCV: 90 fL (ref 80.0–100.0)
NRBC: 0 % (ref 0.0–0.2)
Platelets: 169 10*3/uL (ref 150–400)
Platelets: 198 10*3/uL (ref 150–400)
Platelets: 237 10*3/uL (ref 150–400)
RBC: 3.01 MIL/uL — ABNORMAL LOW (ref 4.22–5.81)
RBC: 3.2 MIL/uL — ABNORMAL LOW (ref 4.22–5.81)
RBC: 4.24 MIL/uL (ref 4.22–5.81)
RDW: 13.1 % (ref 11.5–15.5)
RDW: 13.2 % (ref 11.5–15.5)
RDW: 13.2 % (ref 11.5–15.5)
WBC: 6.9 10*3/uL (ref 4.0–10.5)
WBC: 7.4 10*3/uL (ref 4.0–10.5)
WBC: 8.2 10*3/uL (ref 4.0–10.5)
nRBC: 0 % (ref 0.0–0.2)
nRBC: 0 % (ref 0.0–0.2)

## 2018-11-07 LAB — URINALYSIS, ROUTINE W REFLEX MICROSCOPIC
Bacteria, UA: NONE SEEN
Bilirubin Urine: NEGATIVE
Glucose, UA: NEGATIVE mg/dL
Ketones, ur: NEGATIVE mg/dL
Leukocytes, UA: NEGATIVE
NITRITE: NEGATIVE
Protein, ur: NEGATIVE mg/dL
Specific Gravity, Urine: 1.03 (ref 1.005–1.030)
pH: 5 (ref 5.0–8.0)

## 2018-11-07 LAB — I-STAT CHEM 8, ED
BUN: 15 mg/dL (ref 6–20)
Calcium, Ion: 1.09 mmol/L — ABNORMAL LOW (ref 1.15–1.40)
Chloride: 103 mmol/L (ref 98–111)
Creatinine, Ser: 0.9 mg/dL (ref 0.61–1.24)
Glucose, Bld: 102 mg/dL — ABNORMAL HIGH (ref 70–99)
HCT: 39 % (ref 39.0–52.0)
Hemoglobin: 13.3 g/dL (ref 13.0–17.0)
Potassium: 3.9 mmol/L (ref 3.5–5.1)
SODIUM: 138 mmol/L (ref 135–145)
TCO2: 27 mmol/L (ref 22–32)

## 2018-11-07 LAB — HIV ANTIBODY (ROUTINE TESTING W REFLEX): HIV Screen 4th Generation wRfx: NONREACTIVE

## 2018-11-07 LAB — BASIC METABOLIC PANEL
Anion gap: 6 (ref 5–15)
BUN: 9 mg/dL (ref 6–20)
CO2: 25 mmol/L (ref 22–32)
Calcium: 7.6 mg/dL — ABNORMAL LOW (ref 8.9–10.3)
Chloride: 104 mmol/L (ref 98–111)
Creatinine, Ser: 0.9 mg/dL (ref 0.61–1.24)
GFR calc non Af Amer: >60 mL/min (ref >60)
Glucose, Bld: 148 mg/dL — ABNORMAL HIGH (ref 70–99)
Potassium: 3.5 mmol/L (ref 3.5–5.1)
Sodium: 135 mmol/L (ref 135–145)

## 2018-11-07 LAB — PROTIME-INR
INR: 1.04
Prothrombin Time: 13.5 seconds (ref 11.4–15.2)

## 2018-11-07 LAB — I-STAT CG4 LACTIC ACID, ED: Lactic Acid, Venous: 1.82 mmol/L (ref 0.5–1.9)

## 2018-11-07 LAB — SAMPLE TO BLOOD BANK

## 2018-11-07 LAB — ETHANOL: Alcohol, Ethyl (B): <10 mg/dL (ref <10)

## 2018-11-07 SURGERY — INSERTION, INTRAMEDULLARY ROD, FEMUR, RETROGRADE
Anesthesia: General | Site: Leg Upper | Laterality: Left

## 2018-11-07 MED ORDER — ONDANSETRON HCL 4 MG/2ML IJ SOLN: 4.0000 mg | Freq: Four times a day (QID) | INTRAMUSCULAR | Status: DC | PRN

## 2018-11-07 MED ORDER — SODIUM CHLORIDE 0.9 % IV SOLN
INTRAVENOUS | Status: AC | PRN
  Administered 2018-11-07: 1000 mL via INTRAVENOUS

## 2018-11-07 MED ORDER — ROCURONIUM BROMIDE 50 MG/5ML IV SOSY
PREFILLED_SYRINGE | INTRAVENOUS | Status: DC | PRN
  Administered 2018-11-07: 15 mg via INTRAVENOUS
  Administered 2018-11-07: 50 mg via INTRAVENOUS

## 2018-11-07 MED ORDER — LIDOCAINE 2% (20 MG/ML) 5 ML SYRINGE
INTRAMUSCULAR | Status: DC | PRN
  Administered 2018-11-07: 80 mg via INTRAVENOUS

## 2018-11-07 MED ORDER — LACTATED RINGERS IV SOLN
INTRAVENOUS | Status: DC
  Administered 2018-11-07: 13:00:00 via INTRAVENOUS

## 2018-11-07 MED ORDER — CEFAZOLIN SODIUM-DEXTROSE 1-4 GM/50ML-% IV SOLN
1.0000 g | Freq: Three times a day (TID) | INTRAVENOUS | Status: AC
  Administered 2018-11-07 – 2018-11-08 (×2): 1 g via INTRAVENOUS
  Filled 2018-11-07 (×2): qty 50

## 2018-11-07 MED ORDER — PHENYLEPHRINE 40 MCG/ML (10ML) SYRINGE FOR IV PUSH (FOR BLOOD PRESSURE SUPPORT)
PREFILLED_SYRINGE | INTRAVENOUS | Status: AC
  Filled 2018-11-07: qty 10

## 2018-11-07 MED ORDER — LACTATED RINGERS IV SOLN: INTRAVENOUS | Status: DC

## 2018-11-07 MED ORDER — FENTANYL CITRATE (PF) 250 MCG/5ML IJ SOLN
INTRAMUSCULAR | Status: AC
  Filled 2018-11-07: qty 5

## 2018-11-07 MED ORDER — MIDAZOLAM HCL 2 MG/2ML IJ SOLN
INTRAMUSCULAR | Status: AC
  Filled 2018-11-07: qty 2

## 2018-11-07 MED ORDER — METOCLOPRAMIDE HCL 5 MG/ML IJ SOLN: 5.0000 mg | Freq: Three times a day (TID) | INTRAMUSCULAR | Status: DC | PRN

## 2018-11-07 MED ORDER — CEFAZOLIN SODIUM-DEXTROSE 2-3 GM-%(50ML) IV SOLR
INTRAVENOUS | Status: DC | PRN
  Administered 2018-11-07: 2 g via INTRAVENOUS

## 2018-11-07 MED ORDER — FENTANYL CITRATE (PF) 100 MCG/2ML IJ SOLN
INTRAMUSCULAR | Status: DC | PRN
  Administered 2018-11-07 (×7): 50 ug via INTRAVENOUS

## 2018-11-07 MED ORDER — LACTATED RINGERS IV SOLN
INTRAVENOUS | Status: DC | PRN
  Administered 2018-11-07 (×2): via INTRAVENOUS

## 2018-11-07 MED ORDER — BUPIVACAINE-EPINEPHRINE (PF) 0.25% -1:200000 IJ SOLN
INTRAMUSCULAR | Status: DC | PRN
  Administered 2018-11-07: 10 mL via PERINEURAL

## 2018-11-07 MED ORDER — HYDRALAZINE HCL 20 MG/ML IJ SOLN: 10.0000 mg | INTRAMUSCULAR | Status: DC | PRN

## 2018-11-07 MED ORDER — ONDANSETRON HCL 4 MG/2ML IJ SOLN
INTRAMUSCULAR | Status: DC | PRN
  Administered 2018-11-07: 4 mg via INTRAVENOUS

## 2018-11-07 MED ORDER — DEXAMETHASONE SODIUM PHOSPHATE 10 MG/ML IJ SOLN
INTRAMUSCULAR | Status: AC
  Filled 2018-11-07: qty 1

## 2018-11-07 MED ORDER — EPHEDRINE 5 MG/ML INJ
INTRAVENOUS | Status: AC
  Filled 2018-11-07: qty 10

## 2018-11-07 MED ORDER — DOCUSATE SODIUM 100 MG PO CAPS
100.0000 mg | ORAL_CAPSULE | Freq: Two times a day (BID) | ORAL | Status: DC
  Filled 2018-11-07 (×2): qty 1

## 2018-11-07 MED ORDER — DEXMEDETOMIDINE HCL 200 MCG/2ML IV SOLN
INTRAVENOUS | Status: DC | PRN
  Administered 2018-11-07: 4 ug via INTRAVENOUS
  Administered 2018-11-07: 8 ug via INTRAVENOUS
  Administered 2018-11-07: 4 ug via INTRAVENOUS

## 2018-11-07 MED ORDER — ACETAMINOPHEN 325 MG PO TABS
650.0000 mg | ORAL_TABLET | ORAL | Status: DC | PRN
  Administered 2018-11-07 – 2018-11-09 (×8): 650 mg via ORAL
  Filled 2018-11-07 (×8): qty 2

## 2018-11-07 MED ORDER — OXYCODONE HCL 5 MG PO TABS
5.0000 mg | ORAL_TABLET | ORAL | Status: DC | PRN
  Administered 2018-11-07 – 2018-11-08 (×5): 5 mg via ORAL
  Filled 2018-11-07 (×5): qty 1

## 2018-11-07 MED ORDER — DOCUSATE SODIUM 100 MG PO CAPS
100.0000 mg | ORAL_CAPSULE | Freq: Two times a day (BID) | ORAL | Status: DC
  Administered 2018-11-08: 100 mg via ORAL
  Filled 2018-11-07 (×2): qty 1

## 2018-11-07 MED ORDER — FENTANYL CITRATE (PF) 100 MCG/2ML IJ SOLN
INTRAMUSCULAR | Status: AC
  Filled 2018-11-07: qty 2

## 2018-11-07 MED ORDER — ONDANSETRON 4 MG PO TBDP: 4.0000 mg | ORAL_TABLET | Freq: Four times a day (QID) | ORAL | Status: DC | PRN

## 2018-11-07 MED ORDER — IOHEXOL 300 MG/ML  SOLN
100.0000 mL | Freq: Once | INTRAMUSCULAR | Status: AC | PRN
  Administered 2018-11-07: 100 mL via INTRAVENOUS

## 2018-11-07 MED ORDER — MIDAZOLAM HCL 5 MG/5ML IJ SOLN
INTRAMUSCULAR | Status: DC | PRN
  Administered 2018-11-07: 2 mg via INTRAVENOUS

## 2018-11-07 MED ORDER — METOPROLOL TARTRATE 5 MG/5ML IV SOLN: 5.0000 mg | Freq: Four times a day (QID) | INTRAVENOUS | Status: DC | PRN

## 2018-11-07 MED ORDER — ONDANSETRON HCL 4 MG/2ML IJ SOLN
INTRAMUSCULAR | Status: AC
  Filled 2018-11-07: qty 4

## 2018-11-07 MED ORDER — HYDROMORPHONE HCL 1 MG/ML IJ SOLN
0.5000 mg | INTRAMUSCULAR | Status: DC | PRN
  Administered 2018-11-07 – 2018-11-09 (×20): 0.5 mg via INTRAVENOUS
  Filled 2018-11-07 (×22): qty 1

## 2018-11-07 MED ORDER — PHENOL 1.4 % MT LIQD: 1.0000 | OROMUCOSAL | Status: DC | PRN

## 2018-11-07 MED ORDER — METOCLOPRAMIDE HCL 10 MG PO TABS: 5.0000 mg | ORAL_TABLET | Freq: Three times a day (TID) | ORAL | Status: DC | PRN

## 2018-11-07 MED ORDER — GLYCOPYRROLATE PF 0.2 MG/ML IJ SOSY
PREFILLED_SYRINGE | INTRAMUSCULAR | Status: AC
  Filled 2018-11-07: qty 1

## 2018-11-07 MED ORDER — SUGAMMADEX SODIUM 200 MG/2ML IV SOLN
INTRAVENOUS | Status: DC | PRN
  Administered 2018-11-07: 200 mg via INTRAVENOUS

## 2018-11-07 MED ORDER — CEFAZOLIN SODIUM-DEXTROSE 2-4 GM/100ML-% IV SOLN
INTRAVENOUS | Status: AC
  Filled 2018-11-07: qty 100

## 2018-11-07 MED ORDER — BUPIVACAINE-EPINEPHRINE 0.25% -1:200000 IJ SOLN
INTRAMUSCULAR | Status: AC
  Filled 2018-11-07: qty 1

## 2018-11-07 MED ORDER — LIDOCAINE 2% (20 MG/ML) 5 ML SYRINGE
INTRAMUSCULAR | Status: AC
  Filled 2018-11-07: qty 5

## 2018-11-07 MED ORDER — SODIUM CHLORIDE 0.9 % IV SOLN
INTRAVENOUS | Status: DC
  Administered 2018-11-07 – 2018-11-08 (×3): via INTRAVENOUS

## 2018-11-07 MED ORDER — POLYETHYLENE GLYCOL 3350 17 G PO PACK: 17.0000 g | PACK | Freq: Every day | ORAL | Status: DC | PRN

## 2018-11-07 MED ORDER — ROCURONIUM BROMIDE 50 MG/5ML IV SOSY
PREFILLED_SYRINGE | INTRAVENOUS | Status: AC
  Filled 2018-11-07: qty 5

## 2018-11-07 MED ORDER — METHOCARBAMOL 500 MG PO TABS
500.0000 mg | ORAL_TABLET | Freq: Four times a day (QID) | ORAL | Status: DC | PRN
  Administered 2018-11-07 – 2018-11-09 (×7): 500 mg via ORAL
  Filled 2018-11-07 (×7): qty 1

## 2018-11-07 MED ORDER — GABAPENTIN 300 MG PO CAPS
300.0000 mg | ORAL_CAPSULE | Freq: Three times a day (TID) | ORAL | Status: DC
  Administered 2018-11-07 – 2018-11-10 (×10): 300 mg via ORAL
  Filled 2018-11-07 (×10): qty 1

## 2018-11-07 MED ORDER — SUCCINYLCHOLINE CHLORIDE 20 MG/ML IJ SOLN
INTRAMUSCULAR | Status: DC | PRN
  Administered 2018-11-07: 120 mg via INTRAVENOUS

## 2018-11-07 MED ORDER — MENTHOL 3 MG MT LOZG: 1.0000 | LOZENGE | OROMUCOSAL | Status: DC | PRN

## 2018-11-07 MED ORDER — FENTANYL CITRATE (PF) 100 MCG/2ML IJ SOLN
25.0000 ug | INTRAMUSCULAR | Status: DC | PRN
  Administered 2018-11-07 (×3): 50 ug via INTRAVENOUS

## 2018-11-07 MED ORDER — 0.9 % SODIUM CHLORIDE (POUR BTL) OPTIME
TOPICAL | Status: DC | PRN
  Administered 2018-11-07: 1000 mL

## 2018-11-07 MED ORDER — PROPOFOL 10 MG/ML IV BOLUS
INTRAVENOUS | Status: DC | PRN
  Administered 2018-11-07: 150 mg via INTRAVENOUS
  Administered 2018-11-07: 50 mg via INTRAVENOUS

## 2018-11-07 SURGICAL SUPPLY — 67 items
BANDAGE ACE 4X5 VEL STRL LF (GAUZE/BANDAGES/DRESSINGS) ×1 IMPLANT
BANDAGE ACE 6X5 VEL STRL LF (GAUZE/BANDAGES/DRESSINGS) ×1 IMPLANT
BANDAGE ESMARK 6X9 LF (GAUZE/BANDAGES/DRESSINGS) IMPLANT
BIT DRILL CALIBRATED 4.3MMX365 (DRILL) IMPLANT
BIT DRILL CROWE PNT TWST 4.5MM (DRILL) IMPLANT
BLADE CLIPPER SURG (BLADE) IMPLANT
BLADE SURG 15 STRL LF DISP TIS (BLADE) ×1 IMPLANT
BLADE SURG 15 STRL SS (BLADE) ×2
BNDG COHESIVE 6X5 TAN STRL LF (GAUZE/BANDAGES/DRESSINGS) ×3 IMPLANT
BNDG ESMARK 6X9 LF (GAUZE/BANDAGES/DRESSINGS)
BNDG GAUZE ELAST 4 BULKY (GAUZE/BANDAGES/DRESSINGS) ×1 IMPLANT
COVER SURGICAL LIGHT HANDLE (MISCELLANEOUS) ×6 IMPLANT
COVER WAND RF STERILE (DRAPES) ×3 IMPLANT
CUFF TOURNIQUET SINGLE 34IN LL (TOURNIQUET CUFF) IMPLANT
CUFF TOURNIQUET SINGLE 44IN (TOURNIQUET CUFF) IMPLANT
DRAPE C-ARM 42X72 X-RAY (DRAPES) ×3 IMPLANT
DRAPE C-ARMOR (DRAPES) ×2 IMPLANT
DRAPE HALF SHEET 40X57 (DRAPES) ×6 IMPLANT
DRAPE IMP U-DRAPE 54X76 (DRAPES) ×3 IMPLANT
DRAPE INCISE IOBAN 66X45 STRL (DRAPES) ×4 IMPLANT
DRAPE ORTHO SPLIT 77X108 STRL (DRAPES) ×4
DRAPE SURG ORHT 6 SPLT 77X108 (DRAPES) ×2 IMPLANT
DRAPE U-SHAPE 47X51 STRL (DRAPES) ×3 IMPLANT
DRILL CALIBRATED 4.3MMX365 (DRILL) ×3
DRILL CROWE POINT TWIST 4.5MM (DRILL) ×6
DURAPREP 26ML APPLICATOR (WOUND CARE) ×5 IMPLANT
ELECT REM PT RETURN 9FT ADLT (ELECTROSURGICAL) ×3
ELECTRODE REM PT RTRN 9FT ADLT (ELECTROSURGICAL) ×1 IMPLANT
FACESHIELD WRAPAROUND (MASK) IMPLANT
FACESHIELD WRAPAROUND OR TEAM (MASK) IMPLANT
GAUZE SPONGE 4X4 12PLY STRL (GAUZE/BANDAGES/DRESSINGS) ×6 IMPLANT
GAUZE XEROFORM 5X9 LF (GAUZE/BANDAGES/DRESSINGS) ×1 IMPLANT
GLOVE BIOGEL PI IND STRL 8 (GLOVE) ×2 IMPLANT
GLOVE BIOGEL PI INDICATOR 8 (GLOVE) ×4
GLOVE ORTHO TXT STRL SZ7.5 (GLOVE) ×6 IMPLANT
GOWN STRL REUS W/ TWL LRG LVL3 (GOWN DISPOSABLE) ×1 IMPLANT
GOWN STRL REUS W/ TWL XL LVL3 (GOWN DISPOSABLE) ×1 IMPLANT
GOWN STRL REUS W/TWL 2XL LVL3 (GOWN DISPOSABLE) ×3 IMPLANT
GOWN STRL REUS W/TWL LRG LVL3 (GOWN DISPOSABLE) ×2
GOWN STRL REUS W/TWL XL LVL3 (GOWN DISPOSABLE) ×2
GUIDEPIN 3.2X17.5 THRD DISP (PIN) ×2 IMPLANT
GUIDEWIRE BEAD TIP (WIRE) ×2 IMPLANT
INSERTER CONNECTOR 3.5 SHORT (Insert) ×2 IMPLANT
KIT BASIN OR (CUSTOM PROCEDURE TRAY) ×3 IMPLANT
KIT TURNOVER KIT B (KITS) ×3 IMPLANT
MANIFOLD NEPTUNE II (INSTRUMENTS) ×3 IMPLANT
NAIL FEM RETRO 9X380 (Orthopedic Implant) ×2 IMPLANT
NEEDLE 22X1 1/2 (OR ONLY) (NEEDLE) ×2 IMPLANT
NS IRRIG 1000ML POUR BTL (IV SOLUTION) ×3 IMPLANT
PACK GENERAL/GYN (CUSTOM PROCEDURE TRAY) ×3 IMPLANT
PACK UNIVERSAL I (CUSTOM PROCEDURE TRAY) ×3 IMPLANT
PAD ARMBOARD 7.5X6 YLW CONV (MISCELLANEOUS) ×6 IMPLANT
SCREW CORT TI DBL LEAD 5X34 (Screw) ×2 IMPLANT
SCREW CORT TI DBL LEAD 5X36 (Screw) ×2 IMPLANT
SCREW CORT TI DBL LEAD 5X60 (Screw) ×2 IMPLANT
SCREW CORT TI DBL LEAD 5X80 (Screw) ×2 IMPLANT
STAPLER VISISTAT 35W (STAPLE) ×3 IMPLANT
STOCKINETTE IMPERVIOUS LG (DRAPES) ×3 IMPLANT
SUT VIC AB 0 CT1 27 (SUTURE) ×2
SUT VIC AB 0 CT1 27XBRD ANBCTR (SUTURE) ×1 IMPLANT
SUT VIC AB 2-0 CT1 27 (SUTURE) ×4
SUT VIC AB 2-0 CT1 TAPERPNT 27 (SUTURE) ×1 IMPLANT
SYR CONTROL 10ML LL (SYRINGE) ×2 IMPLANT
TOWEL OR 17X24 6PK STRL BLUE (TOWEL DISPOSABLE) ×3 IMPLANT
TOWEL OR 17X26 10 PK STRL BLUE (TOWEL DISPOSABLE) ×3 IMPLANT
TRAY FOLEY MTR SLVR 16FR STAT (SET/KITS/TRAYS/PACK) IMPLANT
WATER STERILE IRR 1000ML POUR (IV SOLUTION) ×3 IMPLANT
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
## 2018-11-07 NOTE — Interval H&P Note (Signed)
History and Physical Interval Note:  11/07/2018 12:45 PM  Aaron Young  has presented today for surgery, with the diagnosis of left femur shaft fracture  The various methods of treatment have been discussed with the patient and family. After consideration of risks, benefits and other options for treatment, the patient has consented to  Procedure(s): INTRAMEDULLARY (IM) RETROGRADE FEMORAL NAILING (Left) as a surgical intervention .  The patient's history has been reviewed, patient examined, no change in status, stable for surgery.  I have reviewed the patient's chart and labs.  Questions were answered to the patient's satisfaction.     Eldred MangesMark C Greig Altergott
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
## 2018-11-07 NOTE — Anesthesia Procedure Notes (Signed)
Procedure Name: Intubation Date/Time: 11/07/2018 1:41 PM Performed by: Neldon Newport, CRNA Pre-anesthesia Checklist: Timeout performed, Patient being monitored, Suction available, Emergency Drugs available and Patient identified Patient Re-evaluated:Patient Re-evaluated prior to induction Oxygen Delivery Method: Circle system utilized Preoxygenation: Pre-oxygenation with 100% oxygen Induction Type: IV induction Ventilation: Mask ventilation without difficulty Laryngoscope Size: Mac and 4 Grade View: Grade II Tube type: Oral Tube size: 7.5 mm Number of attempts: 1 Placement Confirmation: breath sounds checked- equal and bilateral,  positive ETCO2 and ETT inserted through vocal cords under direct vision Secured at: 23 cm Tube secured with: Tape Dental Injury: Teeth and Oropharynx as per pre-operative assessment
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
## 2018-11-07 NOTE — ED Provider Notes (Signed)
Emergency Ultrasound Study:   Angiocath insertion Performed by: Roxy Horsemanobert Audon Heymann  Consent: Verbal consent obtained. Risks and benefits: risks, benefits and alternatives were discussed Immediately prior to procedure the correct patient, procedure, equipment, support staff and site/side marked as needed.  Indication: difficult IV access Preparation: Patient and probe were prepped with chlorhexidine or alcohol. Sterile gel used. Vein Location: right basilic vein was visualized during assessment for potential access sites and was found to be patent/ easily compressed with linear ultrasound.  1.88 inch, 18 gauge needle was visualized with real-time ultrasound and guided into the vein.  Image not saved or stored due to acuity of condition.  Normal blood return.  Patient tolerance: Patient tolerated the procedure well with no immediate complications.         Roxy HorsemanBrowning, Christian Borgerding, PA-C 11/07/18 0029    Nira Connardama, Pedro Eduardo, MD 11/07/18 [phone]

## 2018-11-07 NOTE — Anesthesia Preprocedure Evaluation (Addendum)
Anesthesia Evaluation  Patient identified by MRN, date of birth, ID band Patient awake    Reviewed: Allergy & Precautions, NPO status , Patient's Chart, lab work & pertinent test results  Airway Mallampati: II  TM Distance: >3 FB     Dental   Pulmonary asthma , COPD, Current Smoker,    breath sounds clear to auscultation       Cardiovascular negative cardio ROS   Rhythm:Regular Rate:Normal     Neuro/Psych    GI/Hepatic negative GI ROS, Neg liver ROS,   Endo/Other  negative endocrine ROS  Renal/GU negative Renal ROS     Musculoskeletal   Abdominal   Peds  Hematology   Anesthesia Other Findings   Reproductive/Obstetrics                             Anesthesia Physical Anesthesia Plan  ASA: III  Anesthesia Plan: General   Post-op Pain Management:    Induction: Intravenous  PONV Risk Score and Plan: 2 and Ondansetron, Dexamethasone and Midazolam  Airway Management Planned: Oral ETT  Additional Equipment:   Intra-op Plan:   Post-operative Plan: Extubation in OR  Informed Consent: I have reviewed the patients History and Physical, chart, labs and discussed the procedure including the risks, benefits and alternatives for the proposed anesthesia with the patient or authorized representative who has indicated his/her understanding and acceptance.   Dental advisory given  Plan Discussed with: CRNA and Anesthesiologist  Anesthesia Plan Comments:         Anesthesia Quick Evaluation

## 2018-11-07 NOTE — Transfer of Care (Signed)
Immediate Anesthesia Transfer of Care Note  Patient: Aaron EndowChristopher M Young  Procedure(s) Performed: INTRAMEDULLARY (IM) RETROGRADE FEMORAL NAILING (Left Leg Upper)  Patient Location: PACU  Anesthesia Type:General  Level of Consciousness: awake and alert   Airway & Oxygen Therapy: Patient Spontanous Breathing and Patient connected to nasal cannula oxygen  Post-op Assessment: Report given to RN, Post -op Vital signs reviewed and stable and Patient moving all extremities X 4  Post vital signs: Reviewed and stable  Last Vitals:  Vitals Value Taken Time  BP 144/75 11/07/2018  3:23 PM  Temp 36.1 C 11/07/2018  3:21 PM  Pulse 81 11/07/2018  3:27 PM  Resp 17 11/07/2018  3:27 PM  SpO2 99 % 11/07/2018  3:27 PM  Vitals shown include unvalidated device data.  Last Pain:  Vitals:   11/07/18 1521  PainSc: 10-Worst pain ever         Complications: No apparent anesthesia complications

## 2018-11-07 NOTE — ED Notes (Signed)
Pt placed on hospital bed for comfort with bucks traction, pt removed other traction splint multiple times
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
## 2018-11-08 LAB — BASIC METABOLIC PANEL
Anion gap: 8 (ref 5–15)
BUN: 7 mg/dL (ref 6–20)
CO2: 23 mmol/L (ref 22–32)
Calcium: 7.5 mg/dL — ABNORMAL LOW (ref 8.9–10.3)
Chloride: 107 mmol/L (ref 98–111)
Creatinine, Ser: 0.96 mg/dL (ref 0.61–1.24)
GFR calc Af Amer: >60 mL/min (ref >60)
GFR calc non Af Amer: >60 mL/min (ref >60)
Glucose, Bld: 106 mg/dL — ABNORMAL HIGH (ref 70–99)
Potassium: 4 mmol/L (ref 3.5–5.1)
Sodium: 138 mmol/L (ref 135–145)

## 2018-11-08 LAB — CBC
HCT: 24.9 % — ABNORMAL LOW (ref 39.0–52.0)
HCT: 30.6 % — ABNORMAL LOW (ref 39.0–52.0)
HEMOGLOBIN: 8.2 g/dL — AB (ref 13.0–17.0)
HEMOGLOBIN: 10.1 g/dL — AB (ref 13.0–17.0)
MCH: 29.7 pg (ref 26.0–34.0)
MCH: 29.3 pg (ref 26.0–34.0)
MCHC: 32.9 g/dL (ref 30.0–36.0)
MCHC: 33 g/dL (ref 30.0–36.0)
MCV: 90.2 fL (ref 80.0–100.0)
MCV: 88.7 fL (ref 80.0–100.0)
Platelets: UNDETERMINED 10*3/uL (ref 150–400)
Platelets: 183 10*3/uL (ref 150–400)
RBC: 2.76 MIL/uL — ABNORMAL LOW (ref 4.22–5.81)
RBC: 3.45 MIL/uL — ABNORMAL LOW (ref 4.22–5.81)
RDW: 13.2 % (ref 11.5–15.5)
RDW: 13.2 % (ref 11.5–15.5)
WBC: 5.6 10*3/uL (ref 4.0–10.5)
WBC: 8.5 10*3/uL (ref 4.0–10.5)
nRBC: 0 % (ref 0.0–0.2)
nRBC: 0 % (ref 0.0–0.2)

## 2018-11-08 LAB — CDS SEROLOGY

## 2018-11-08 MED ORDER — TRAMADOL HCL 50 MG PO TABS
50.0000 mg | ORAL_TABLET | Freq: Four times a day (QID) | ORAL | Status: DC
  Administered 2018-11-08 – 2018-11-09 (×4): 50 mg via ORAL
  Filled 2018-11-08 (×4): qty 1

## 2018-11-08 MED ORDER — OXYCODONE HCL 5 MG PO TABS
5.0000 mg | ORAL_TABLET | ORAL | Status: DC | PRN
  Administered 2018-11-08 – 2018-11-10 (×11): 10 mg via ORAL
  Filled 2018-11-08 (×11): qty 2

## 2018-11-08 NOTE — Progress Notes (Signed)
   Subjective: 1 Day Post-Op Procedure(s) (LRB): INTRAMEDULLARY (IM) RETROGRADE FEMORAL NAILING (Left) Patient reports pain as moderate and severe.    Objective: Vital signs in last 24 hours: Temp:  [97 F (36.1 C)-99.1 F (37.3 C)] 98.4 F (36.9 C) (12/10 0751) Pulse Rate:  [77-102] 95 (12/10 0800) Resp:  [11-25] 25 (12/10 0800) BP: (127-144)/(72-85) 143/84 (12/10 0751) SpO2:  [95 %-100 %] 95 % (12/10 0800) FiO2 (%):  [2 %] 2 % (12/09 2142)  Intake/Output from previous day: 12/09 0701 - 12/10 0700 In: 4828.3 [I.V.:4778.3; IV Piggyback:50] Out: 2000 [Urine:1900; Blood:100] Intake/Output this shift: Total I/O In: 762.2 [P.O.:400; I.V.:312.2; IV Piggyback:50] Out: -   Recent Labs    11/07/18 0006 11/07/18 0007 11/07/18 0906 11/07/18 1835 11/08/18 0426  HGB 13.3 12.5* 9.4* 8.8* 8.2*   Recent Labs    11/07/18 1835 11/08/18 0426  WBC 7.4 5.6  RBC 3.01* 2.76*  HCT 26.7* 24.9*  PLT 169 PLATELET CLUMPS NOTED ON SMEAR, UNABLE TO ESTIMATE   Recent Labs    11/07/18 0343 11/08/18 0426  NA 135 138  K 3.5 4.0  CL 104 107  CO2 25 23  BUN 9 7  CREATININE 0.90 0.96  GLUCOSE 148* 106*  CALCIUM 7.6* 7.5*   Recent Labs    11/07/18 0007  INR 1.04    Neurologically intact Dg C-arm 1-60 Min  Result Date: 11/07/2018 CLINICAL DATA:  Left femoral fracture EXAM: DG C-ARM 61-120 MIN; LEFT FEMUR 2 VIEWS COMPARISON:  Film from earlier in the same day. FLUOROSCOPY TIME:  Radiation Exposure Index (as provided by the fluoroscopic device): Not available If the device does not provide the exposure index: Fluoroscopy Time:  2 minutes 35 seconds Number of Acquired Images:  6 FINDINGS: Medullary rod is now seen with proximal and distal fixation screws. The femoral fracture is in near anatomic alignment. No soft tissue abnormality is noted. IMPRESSION: Status post ORIF of left femoral fracture. Electronically Signed   By: Alcide CleverMark  Lukens M.D.   On: 11/07/2018 15:09   Dg Femur Min 2 Views  Left  Result Date: 11/07/2018 CLINICAL DATA:  Left femoral fracture EXAM: DG C-ARM 61-120 MIN; LEFT FEMUR 2 VIEWS COMPARISON:  Film from earlier in the same day. FLUOROSCOPY TIME:  Radiation Exposure Index (as provided by the fluoroscopic device): Not available If the device does not provide the exposure index: Fluoroscopy Time:  2 minutes 35 seconds Number of Acquired Images:  6 FINDINGS: Medullary rod is now seen with proximal and distal fixation screws. The femoral fracture is in near anatomic alignment. No soft tissue abnormality is noted. IMPRESSION: Status post ORIF of left femoral fracture. Electronically Signed   By: Alcide CleverMark  Lukens M.D.   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 Yesterday took max pain meds offered and seen 3 times , needed sternal rub to wake him up twice.  Best to use PO vs IV meds. Post op xrays look good.   Eldred MangesMark C Acsa Estey 11/08/2018, 8:57 AM
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
## 2018-11-08 NOTE — Progress Notes (Signed)
PT Cancellation Note  Patient Details Name: Aaron EndowChristopher M Young MRN: [id number] DOB: [birth year]   Cancelled Treatment:    Reason Eval/Treat Not Completed: Active bedrest order   Enedina FinnerMaija B Stein Windhorst 11/08/2018, 7:15 AM  Delaney MeigsMaija Tabor Yazleen Molock, PT Acute Rehabilitation Services Pager: [phone] Office: [phone]
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
## 2018-11-08 NOTE — Anesthesia Postprocedure Evaluation (Signed)
Anesthesia Post Note  Patient: Aaron EndowChristopher M Young  Procedure(s) Performed: INTRAMEDULLARY (IM) RETROGRADE FEMORAL NAILING (Left Leg Upper)     Patient location during evaluation: PACU Anesthesia Type: General Level of consciousness: awake Pain management: pain level controlled Vital Signs Assessment: post-procedure vital signs reviewed and stable Respiratory status: spontaneous breathing Cardiovascular status: stable Postop Assessment: no headache Anesthetic complications: no    Last Vitals:  Vitals:   11/08/18 1510 11/08/18 1530  BP: (!) 143/89   Pulse: 84 80  Resp: (!) 22 (!) 21  Temp: 36.9 C   SpO2: 100% 100%    Last Pain:  Vitals:   11/08/18 1523  TempSrc:   PainSc: 9                  [name]

## 2018-11-09 ENCOUNTER — Encounter (HOSPITAL_COMMUNITY): Payer: Self-pay | Admitting: General Practice

## 2018-11-09 ENCOUNTER — Other Ambulatory Visit: Payer: Self-pay

## 2018-11-09 LAB — CBC
HCT: 26.4 % — ABNORMAL LOW (ref 39.0–52.0)
HEMOGLOBIN: 8.5 g/dL — AB (ref 13.0–17.0)
MCH: 28.8 pg (ref 26.0–34.0)
MCHC: 32.2 g/dL (ref 30.0–36.0)
MCV: 89.5 fL (ref 80.0–100.0)
Platelets: 178 10*3/uL (ref 150–400)
RBC: 2.95 MIL/uL — ABNORMAL LOW (ref 4.22–5.81)
RDW: 13.2 % (ref 11.5–15.5)
WBC: 8.1 10*3/uL (ref 4.0–10.5)
nRBC: 0 % (ref 0.0–0.2)

## 2018-11-09 LAB — BASIC METABOLIC PANEL
Anion gap: 7 (ref 5–15)
BUN: <5 mg/dL — ABNORMAL LOW (ref 6–20)
CO2: 26 mmol/L (ref 22–32)
Calcium: 7.8 mg/dL — ABNORMAL LOW (ref 8.9–10.3)
Chloride: 103 mmol/L (ref 98–111)
Creatinine, Ser: 0.87 mg/dL (ref 0.61–1.24)
GFR calc Af Amer: >60 mL/min (ref >60)
GFR calc non Af Amer: >60 mL/min (ref >60)
GLUCOSE: 109 mg/dL — AB (ref 70–99)
Potassium: 3.3 mmol/L — ABNORMAL LOW (ref 3.5–5.1)
Sodium: 136 mmol/L (ref 135–145)

## 2018-11-09 MED ORDER — ACETAMINOPHEN 500 MG PO TABS
1000.0000 mg | ORAL_TABLET | Freq: Four times a day (QID) | ORAL | Status: DC
  Administered 2018-11-09 – 2018-11-10 (×5): 1000 mg via ORAL
  Filled 2018-11-09 (×5): qty 2

## 2018-11-09 MED ORDER — METHOCARBAMOL 500 MG PO TABS: 1000.0000 mg | ORAL_TABLET | Freq: Four times a day (QID) | ORAL | Status: DC | PRN

## 2018-11-09 MED ORDER — DIPHENHYDRAMINE HCL 25 MG PO CAPS
25.0000 mg | ORAL_CAPSULE | Freq: Every day | ORAL | Status: DC
  Administered 2018-11-09: 25 mg via ORAL
  Filled 2018-11-09: qty 1

## 2018-11-09 MED ORDER — HYDROMORPHONE HCL 1 MG/ML IJ SOLN
0.5000 mg | INTRAMUSCULAR | Status: DC | PRN
  Administered 2018-11-09: 0.5 mg via INTRAVENOUS
  Filled 2018-11-09: qty 1

## 2018-11-09 MED ORDER — TRAMADOL HCL 50 MG PO TABS
100.0000 mg | ORAL_TABLET | Freq: Four times a day (QID) | ORAL | Status: DC
  Administered 2018-11-09 – 2018-11-10 (×5): 100 mg via ORAL
  Filled 2018-11-09 (×5): qty 2

## 2018-11-09 MED ORDER — POTASSIUM CHLORIDE CRYS ER 20 MEQ PO TBCR
40.0000 meq | EXTENDED_RELEASE_TABLET | Freq: Once | ORAL | Status: AC
  Administered 2018-11-09: 40 meq via ORAL
  Filled 2018-11-09: qty 2
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
## 2018-11-09 NOTE — Discharge Instructions (Addendum)
Weightbearing as tolerated to left leg Work on knee range of motion and quad exercises from your physical therapist   Splenic Injury A splenic injury is an injury of the spleen. The spleen is an organ located in the upper left area of your abdomen, just under your ribs. Your spleen filters and cleans your blood. It also stores blood cells and destroys cells that are worn out. Your spleen is also important for fighting disease. Splenic injuries can vary. In some cases, the spleen may only be bruised with some bleeding inside the covering and around the spleen. Splenic injuries may also cause a deep tear or cut into the spleen (lacerated spleen). Some splenic injuries can cause the spleen to break open (rupture). What are the causes? Splenic injuries can be caused by a direct blow (blunt trauma) from:  Car accidents.  Contact sports.  Falls.  Gunshot wounds or knife wounds (penetrating injuries) can also cause a splenic injury. What increases the risk? You may be at greater risk for a splenic injury if you have a disease that can cause the spleen to become enlarged. These include:  Alcoholic liver disease.  Viral infections, especially mononucleosis.  What are the signs or symptoms? A minor splenic injury often causes no symptoms or only minor abdominal pain. If the injury causes severe bleeding, your blood pressure may rapidly decrease. This may cause:  Dizziness or light-headedness.  Rapid heart rate.  Difficulty breathing.  Fainting.  Sweating with clammy skin.  Other signs and symptoms of a splenic injury can include:  Very bad abdominal pain.  Pain in the left shoulder.  Pain when the abdomen is pressed (tenderness).  Nausea.  Swelling or bruising of the abdomen.  How is this diagnosed? Your health care provider may suspect a splenic injury based on your signs and symptoms, especially if you were recently in an accident or you recently got hurt. Your health care  provider will do a physical exam. Imaging tests may be done to confirm the diagnosis. These may include:  Ultrasound.  CT scan.  You may have frequent blood tests for a few days after the injury to monitor your condition. How is this treated? Treatment depends on the type of splenic injury you have and how bad it is. Your health care provider will develop a treatment plan specific to your needs.  Less severe injuries may be treated with: ? Observation. ? Interventional radiology. This involves using flexible tubes (catheters) to stop the bleeding from inside the blood vessel.  More severe injuries may require hospitalization in the intensive care unit (ICU). While you are in the ICU: ? Your fluid and blood levels will be monitored closely. ? You will get fluids through an IV tube as needed. ? You may need follow-up scans to check whether your spleen is able to heal itself. If the injury is getting worse, you may need surgery. ? You may receive donated blood (transfusion). ? You may have a long needle inserted into your abdomen to remove any blood that has collected inside the spleen (hematoma).  Surgery. If your blood pressure is too low, you may need emergency surgery. This may include: ? Repairing a laceration. ? Removing part of the spleen. ? Removing the entire spleen (splenectomy).  Follow these instructions at home:  Take medicines only as directed by your health care provider.  Rest at home.  Do not participate in any strenuous activity until your health care provider says it is safe to do  so.  Do not lift anything that is heavier than 10 lb (4.5 kg).  Do not participate in contact sports until your health care provider says it is safe to do so.  Stay up-to-date on vaccinations as told by your health care provider. Contact a health care provider if:  You have a fever.  You have new or increasing pain in your abdomen or in your left shoulder. Get help right away  if:  You have signs or symptoms of internal bleeding. Watch for: ? Sweating. ? Dizziness. ? Weakness. ? Cold and clammy skin. ? Fainting.  You have chest pain or difficulty breathing. This information is not intended to replace advice given to you by your health care provider. Make sure you discuss any questions you have with your health care provider. Document Released: 09/07/2006 Document Revised: 07/14/2016 Document Reviewed: 08/01/2014 Elsevier Interactive Patient Education  2017 Elsevier Inc.    Opioid Pain Medicine Information Opioids are powerful medicines that are used to treat moderate to severe pain. Opioids should be taken with the supervision of a trained health care provider. They should be taken for the shortest period of time as possible. This is because opioids can be addictive and the longer you take opioids, the greater your risk of addiction (opioid use disorder). What do opioids do? Opioids help to reduce or eliminate pain. When used for short periods of time, they can help you:  Sleep better.  Do better in physical or occupational therapy.  Feel better in the first few days after an injury.  Recover from surgery.  What is a pain treatment plan? A pain treatment plan is an agreement between you and your health care provider. Pain is unique to each person, and treatments vary depending on your condition. To manage your pain successfully, you and your health care provider need to understand each other and work together. To help you do this:  Discuss the goals of your treatment, including how much pain you might expect to have and how you will manage the pain.  Review the risks and benefits of taking opioid medicines for your condition.  Remember that a good treatment plan uses more than one approach and minimizes the chance of side effects.  Be honest about the amount of medicines you take, and about any drug or alcohol use.  Get pain medicine prescriptions  from only one care provider.  Keep all follow-up visits as told by your health care provider. This is important.  What instructions should I follow while taking opioid pain medicine? While you are taking the medicine and for 8 hours after you stop taking the medicine, follow these instructions:  Do not drive.  Do not use machinery or power tools.  Do not sign legal documents.  Do not drink alcohol.  Do not take sleeping pills.  Do not supervise children by yourself.  Do not participate in activities that require climbing or being in high places.  Do not enter a body of water--such as a lake, river, ocean, spa, or swimming pool--unless an adult is nearby who can monitor and help you.  What kinds of side effects can opioids cause? Opioids can cause side effects, such as:  Constipation.  Nausea.  Vomiting.  Drowsiness.  Confusion.  Opioid use disorder.  Breathing difficulties (respiratory depression).  Using opioid pain medicines for longer than 3 days increases your risk of these side effects. Taking opioid pain medicine for a long period of time can affect your ability to  do daily tasks. It also puts you at risk for:  Motor vehicle accidents.  Depression.  Suicide.  Heart attack.  Overdose, which can sometimes lead to death.  What are alternative ways to manage pain? Pain can be managed with many types of alternative treatments. Ask your health care provider to refer you to one or more specialists who can help you manage pain through:  Physical or occupational therapy.  Counseling (cognitive behavioral therapy).  Good nutrition.  Biofeedback.  Massage.  Meditation.  Non-opioid medicine.  Following a gentle exercise program.  How can I keep others safe while I am taking opioid pain medicine?  Keep pain medicine in a locked cabinet, or in a secure area where children cannot reach it.  Never share your pain medicine with anyone.  Do not save  any leftover pills. If you have leftover medicine, you can: 1. Bring the medicine to a prescription take-back program. This is usually offered by the county or Patent examinerlaw enforcement. 2. Throw it out in the trash. To do this:  Mix the medicine with undesirable trash such as pet waste or food.  Put the mixture in a sealed container or plastic bag.  Throw it in the trash.  Destroy any personal information on the prescription bottle. How do I stop taking opioids if I have been taking them for a long time? If you have been taking opioid medicine for more than a few weeks, you may need to slowly decrease (taper) how much you take until you stop completely. Tapering your use of opioids can decrease your chances of experiencing withdrawal symptoms, such as:  Pain and cramping in the abdomen.  Nausea.  Sweating.  Sleepiness.  Restlessness.  Uncontrollable shaking (tremors).  Cravings for the medicine.  Do not attempt to taper your use of opioids on your own. Talk with your health care provider about how to do this. Your health care provider may prescribe a step-down schedule based on how much medicine you are taking and how long you have been taking it. Where to find support: If you have been taking opioids for a long time, you may benefit from receiving support for quitting from a local support group or counselor. Ask your health care provider for a referral to these resources in your area. Where to find more information:  Centers for Disease Control and Prevention (CDC): DiscoHelp.siwww.cdc.gov/drugoverdose/opioids/index.html Get help right away if: Seek medical care right away if you are taking opioids and you (or people close to you) notice any of the following:  Difficulty breathing.  Breathing that is slower or more shallow than normal.  A very slow heartbeat (pulse).  Severe confusion.  Unconsciousness.  Sleepiness.  Slurred speech.  Nausea and vomiting.  Cold, clammy skin.  Blue  lips or fingernails.  Limpness.  Abnormally small pupils.  If you think that you or someone else may have taken too much of an opioid medicine, get medical help right away. Do not wait to see if the symptoms go away on their own.  If you ever feel like you may hurt yourself or others, or have thoughts about taking your own life, get help right away. You can go to your nearest emergency department or call:  Your local emergency services (911 in the [place]  The hotline of the [facility] 301 677 6590(1-5813468268 in the [place]  A suicide crisis helpline, such as the National Suicide Prevention Lifeline at (909)836-29281-979-643-4040. This is open 24 hours a day.  Summary  Opioid  medicines can help you manage moderate to severe pain for a short period of time.  Discuss the goals of your treatment with your health care provider, including how much pain you might expect to have and how you will manage the pain.  A good treatment plan uses more than one approach. Pain can be managed with many types of alternative treatments.  If you think that you or someone else may have taken too much of an opioid, get medical help right away. This information is not intended to replace advice given to you by your health care provider. Make sure you discuss any questions you have with your health care provider. Document Released: 12/13/2015 Document Revised: 03/05/2017 Document Reviewed: 06/28/2015 Elsevier Interactive Patient Education  2018 Elsevier Inc.     ORTHOPEDIC DISCHARGE INSTRUCTIONS  -OK TO SHOWER BUT NO TUB SOAKING.  DO NOT APPLY ANY CREAMS OR OINTMENTS TO INCISIONS. DRESSING CHANGES WITH GAUZE AND TAPE  -ABSOLUTELY NO DRIVING (INCLUDING SCOOTER)  -NO AGGRESSIVE ACTIVITY.   -IF YOU HAVE FEVER, CHILLS, OR INCREASE IN YOUR PAIN YOU SHOULD CONTACT OUR OFFICE AND/OR GO IMMEDIATELY TO EMERGENCY ROOM.

## 2018-11-09 NOTE — Progress Notes (Signed)
Pain is unrelieved all night. Patient is not sleeping. Lung sounds have increased wheezing especially the left side. I believe this is from fluid overload and lack of movement. I stopped continuous fluids because of lung sounds. Aaron SarahGina Sharlynn Seckinger, RN
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
## 2018-11-09 NOTE — Progress Notes (Signed)
Subjective: C/o knee and thigh pain.     Objective: Vital signs in last 24 hours: Temp:  [98 F (36.7 C)-99.3 F (37.4 C)] 98.8 F (37.1 C) (12/11 1525) Pulse Rate:  [80-88] 88 (12/11 1525) Resp:  [12-18] 17 (12/11 1525) BP: (131-142)/(80-97) 136/88 (12/11 1525) SpO2:  [84 %-100 %] 99 % (12/11 1525)  Intake/Output from previous day: 12/10 0701 - 12/11 0700 In: 1321.1 [P.O.:640; I.V.:631.1; IV Piggyback:50] Out: 1950 [Urine:1950] Intake/Output this shift: Total I/O In: 240 [P.O.:240] Out: 450 [Urine:450]  Recent Labs    11/07/18 0906 11/07/18 1835 11/08/18 0426 11/08/18 1449 11/09/18 0320  HGB 9.4* 8.8* 8.2* 10.1* 8.5*   Recent Labs    11/08/18 1449 11/09/18 0320  WBC 8.5 8.1  RBC 3.45* 2.95*  HCT 30.6* 26.4*  PLT 183 178   Recent Labs    11/08/18 0426 11/09/18 0320  NA 138 136  K 4.0 3.3*  CL 107 103  CO2 23 26  BUN 7 <5*  CREATININE 0.96 0.87  GLUCOSE 106* 109*  CALCIUM 7.5* 7.8*   Recent Labs    11/07/18 0007  INR 1.04    Exam: Pleasant male.  Alert and oriented, NAD.  Wounds look good.  Staples intact.  No drainage or signs of infection.  NVI.  Left thigh swelling.  Soft.        Assessment/Plan: Continue present care.  Dressing changed.    Zonia KiefJames Gabrien Mentink 11/09/2018, 3:35 PM

## 2018-11-10 ENCOUNTER — Encounter (HOSPITAL_COMMUNITY): Payer: Self-pay | Admitting: Orthopaedic Surgery

## 2018-11-10 ENCOUNTER — Encounter: Payer: Self-pay | Admitting: Emergency Medicine

## 2018-11-10 LAB — CBC
HEMATOCRIT: 28.8 % — AB (ref 39.0–52.0)
Hemoglobin: 9.5 g/dL — ABNORMAL LOW (ref 13.0–17.0)
MCH: 29.2 pg (ref 26.0–34.0)
MCHC: 33 g/dL (ref 30.0–36.0)
MCV: 88.6 fL (ref 80.0–100.0)
Platelets: 232 10*3/uL (ref 150–400)
RBC: 3.25 MIL/uL — ABNORMAL LOW (ref 4.22–5.81)
RDW: 13.2 % (ref 11.5–15.5)
WBC: 6.2 10*3/uL (ref 4.0–10.5)
nRBC: 0 % (ref 0.0–0.2)

## 2018-11-10 MED ORDER — ACETAMINOPHEN 500 MG PO TABS: 1000.0000 mg | ORAL_TABLET | Freq: Three times a day (TID) | ORAL | 0 refills | Status: DC

## 2018-11-10 MED ORDER — TRAMADOL HCL 50 MG PO TABS: 100.0000 mg | ORAL_TABLET | Freq: Four times a day (QID) | ORAL | 0 refills | Status: DC | PRN

## 2018-11-10 MED ORDER — POLYETHYLENE GLYCOL 3350 17 G PO PACK: 17.0000 g | PACK | Freq: Every day | ORAL | 0 refills | Status: DC | PRN

## 2018-11-10 MED ORDER — OXYCODONE HCL 5 MG PO TABS: 10.0000 mg | ORAL_TABLET | ORAL | 0 refills | Status: DC | PRN

## 2018-11-10 MED ORDER — ACETAMINOPHEN 500 MG PO TABS: 1000.0000 mg | ORAL_TABLET | Freq: Three times a day (TID) | ORAL | 0 refills | Status: DC | PRN

## 2018-11-10 MED FILL — oxyCODONE HCL 5 MG TABS: 5 | 3 days supply | Qty: 30 | Fill #0

## 2018-11-10 MED FILL — ACETAMINOPHEN EXTRA STRENGT: 500 | 5 days supply | Qty: 30 | Fill #0

## 2018-11-10 MED FILL — POLYETHYLENE GLYCOL 3350 PO: 14 days supply | Qty: 238 | Fill #0

## 2018-11-10 MED FILL — traMADol HCL 50 MG TABS: 50 | 3 days supply | Qty: 20 | Fill #0
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
## 2018-11-10 NOTE — Progress Notes (Signed)
Subjective: Patient doing well.  Pain controlled.  Ready to go home.    Objective: Vital signs in last 24 hours: Temp:  [97.8 F (36.6 C)-99 F (37.2 C)] 97.8 F (36.6 C) (12/12 1140) Pulse Rate:  [81-88] 85 (12/12 1140) Resp:  [14-23] 23 (12/12 1140) BP: (134-138)/(86-97) 134/92 (12/12 1140) SpO2:  [84 %-100 %] 97 % (12/12 1140)  Intake/Output from previous day: 12/11 0701 - 12/12 0700 In: 960 [P.O.:960] Out: 1550 [Urine:1550] Intake/Output this shift: No intake/output data recorded.  Recent Labs    11/07/18 1835 11/08/18 0426 11/08/18 1449 11/09/18 0320 11/10/18 0702  HGB 8.8* 8.2* 10.1* 8.5* 9.5*   Recent Labs    11/09/18 0320 11/10/18 0702  WBC 8.1 6.2  RBC 2.95* 3.25*  HCT 26.4* 28.8*  PLT 178 232   Recent Labs    11/08/18 0426 11/09/18 0320  NA 138 136  K 4.0 3.3*  CL 107 103  CO2 23 26  BUN 7 <5*  CREATININE 0.96 0.87  GLUCOSE 106* 109*  CALCIUM 7.5* 7.8*   No results for input(s): LABPT, INR in the last 72 hours.  Exam: Very pleasant male, alert and oriented. NAD. Dressing C/D/I.  Calf nontender. NVI.       Assessment/Plan: Patient stable from ortho standpoint to D/C home.  Follow up with Dr Ophelia CharterYates in 2 weeks.     Zonia KiefJames Sheletha Bow 11/10/2018, 11:51 AM

## 2018-11-15 ENCOUNTER — Telehealth (INDEPENDENT_AMBULATORY_CARE_PROVIDER_SITE_OTHER): Payer: Self-pay | Admitting: Orthopaedic Surgery

## 2018-11-15 NOTE — Telephone Encounter (Signed)
Please advise on HH orders

## 2018-11-15 NOTE — Telephone Encounter (Signed)
Ok to skip therapy. He is 37 with good knee ROM and getting around well on crutches.

## 2018-11-15 NOTE — Telephone Encounter (Signed)
Received voicemail message from Loura HaltStacy David (PT) [facility] needing verbal orders for HHPT. She said she can not go out to see patient without the order. The number to contact Kennyth ArnoldStacy is [phone]

## 2018-11-16 ENCOUNTER — Other Ambulatory Visit (INDEPENDENT_AMBULATORY_CARE_PROVIDER_SITE_OTHER): Payer: Self-pay | Admitting: Orthopaedic Surgery

## 2018-11-16 MED ORDER — OXYCODONE-ACETAMINOPHEN 5-325 MG PO TABS: 1.0000 | ORAL_TABLET | Freq: Four times a day (QID) | ORAL | 0 refills | Status: DC | PRN

## 2018-11-16 NOTE — Telephone Encounter (Signed)
Ucall. I sent in # 40 tabs percocet 5/325 one po q 6 hrs prn pain to [facility] [place]. ucall make F/U appt Friday thanks  rx sent in electronically

## 2018-11-16 NOTE — Telephone Encounter (Signed)
IC patient and advised Rx sent to pharm.  Appointment made as well.

## 2018-11-16 NOTE — Telephone Encounter (Signed)
IC patient to discuss and he says the ROM of the knee is not good.  Maybe 20 degrees, and that is with them helping him exercise it.  He has a lot of swelling and it hurts, painful to move it. He has questions about pain med as well- Oxycodone says take 2 tabs q 4 hrs, which is 8 tabs per day up to 7 days. 4 days = 32 pills and he only received #30.  He is out now.  Had some tramadol as well and is out of that too now.  Please advise on all?

## 2018-11-17 ENCOUNTER — Telehealth (INDEPENDENT_AMBULATORY_CARE_PROVIDER_SITE_OTHER): Payer: Self-pay | Admitting: Orthopaedic Surgery

## 2018-11-17 NOTE — Telephone Encounter (Signed)
Joellen JerseyStacey David, PT, from [facility], requesting [facility] following  2x a week for 2 weeks 1x a week for 2 weeks.  CB#[phone].  Thank you.

## 2018-11-17 NOTE — Telephone Encounter (Signed)
I left voicemail advising. ?

## 2018-11-17 NOTE — Telephone Encounter (Signed)
OK - thanks

## 2018-11-17 NOTE — Telephone Encounter (Signed)
Per rx info in chart, receipt to pharmacy was concerned. I called [facility] in [place] at [phone] but pharmacy is closed until 9am. I will retry then.

## 2018-11-17 NOTE — Telephone Encounter (Signed)
Patient called this morning stating that his pharmacy advised him that they did not have a prescription for Oxycodone for him.  Please advise patient when the RX is sent to the pharmacy.  Thank you. CB#[phone].

## 2018-11-17 NOTE — Telephone Encounter (Signed)
I called patient and he states that he got RX from pharmacy. He states he had asked it be sent to a different pharmacy in [place], but he got it figured out.  Patient also states that per his care team at the hospital, he is not supposed to have to pay for anything at all, including copays or prescriptions. He would like to know if we can check on this because he had to pay $37.00 for his Oxycodone. He did state that Miralax was called in as well, but that he did not pick that up because he has some at home. He also says that he has had opiates before and they don't bother his GI system.  Wendy-can you [facility]?

## 2018-11-17 NOTE — Telephone Encounter (Signed)
Ok for orders? 

## 2018-11-17 NOTE — Telephone Encounter (Signed)
I called Aaron Young at the pharmacy and she states patient picked up Rx this morning. It was ready yesterday for pick up.

## 2018-11-17 NOTE — Telephone Encounter (Signed)
Aaron Young -(OT[facility] needing verbal orders for 2 wk 1 (Just 1 visit) She advised she will be seeing patient tomorrow at 8:00am. The number to contact Darral Dashsther is [phone]

## 2018-11-17 NOTE — Telephone Encounter (Signed)
Ok thanks 

## 2018-11-18 ENCOUNTER — Ambulatory Visit (INDEPENDENT_AMBULATORY_CARE_PROVIDER_SITE_OTHER): Payer: Self-pay | Admitting: Orthopaedic Surgery

## 2018-11-18 NOTE — Telephone Encounter (Signed)
I called Aaron Young and advised.

## 2018-11-25 NOTE — Telephone Encounter (Signed)
noted 

## 2018-11-25 NOTE — Telephone Encounter (Signed)
I see patient no showed his appt on 11/18/18.  I do not know who to call for this.  If he comes back in for an appointment, I would recommend he call the financial [facility] who approved his financial assistance, and inquire with them on this. No action needed now, FYI to you only.

## 2018-12-09 ENCOUNTER — Telehealth (INDEPENDENT_AMBULATORY_CARE_PROVIDER_SITE_OTHER): Payer: Self-pay | Admitting: Orthopaedic Surgery

## 2018-12-09 NOTE — Telephone Encounter (Signed)
I called [name] and advised ok for continued PT. Patient needs to come in to the office to have staples and stitches removed and to have xray.  She states that the patient has removed 2 staples himself already. He also has swelling so they found a compression stocking and applied it.  I called patient and he states that he has spoken with someone for transportation and they are supposed to call him back with approval on Monday. He will then call the office and we will work him into the schedule to see Dr. Ophelia Charter for his post op visit.  I advised we must see him back in the office.

## 2018-12-09 NOTE — Telephone Encounter (Signed)
OK for more HHPT. Needs ROV , has not been seen since surgery . thanks

## 2018-12-09 NOTE — Telephone Encounter (Signed)
Please advise 

## 2018-12-09 NOTE — Telephone Encounter (Signed)
[facility]  [name]  [phone]     Verbal orders   1 wk 3 continue PT in home   Patient still has staples in left knee unable to get to appointment stacy needs a plan to assist patient. A total of 19 stiches that need to be removed [name] is qualified to remove them, in need of orders to proceed with removal. Can leave detailed message on her voicemail.

## 2018-12-21 ENCOUNTER — Telehealth (INDEPENDENT_AMBULATORY_CARE_PROVIDER_SITE_OTHER): Payer: Self-pay | Admitting: Orthopaedic Surgery

## 2018-12-21 NOTE — Telephone Encounter (Signed)
[name] PT  [facility]  [phone]    [name] called would like to speak with patient to discuss patient care

## 2018-12-22 NOTE — Telephone Encounter (Signed)
Pt noncompliant. Will wait until he shows up to access his care further.

## 2018-12-22 NOTE — Telephone Encounter (Signed)
noted 

## 2018-12-22 NOTE — Telephone Encounter (Signed)
Staples are protruding more, skin is irritated.  Only has 12 staples left, ? De [place]ings[place] out or pulled out.  Patient has +3 pitting edema in surgical leg and +1 pitting edema in other leg. He is able to do the ambulation with toe touch weight bearing, however his strength in that leg is not good. Kennyth Arnold states that he has actually regressed with ROM, she had been getting 75 degree flexion but was only able to get 65 degree flexion at last visit. He is also lacking 9 degrees extension.  Kennyth Arnold feels some of this is due to swelling, but states patient cries during exercises and cannot handle the pain. He does not have anything to take at home for pain, not even tylenol.  Kennyth Arnold feels that she should D/C HHPT for the fact patient has not been compliant in coming in for a follow up appt and she does not feel comfortable treating without that. She has reminded patient to use ice and elevate for swelling.  Per Kennyth Arnold, patient has gotten documentation in regards to being able to get a ride for an appointment with our office, but he has to wait for a card to come in the mail to be able to use the transportation service.  Please advise if you need Stacy to do anything further.

## 2019-01-03 ENCOUNTER — Ambulatory Visit (INDEPENDENT_AMBULATORY_CARE_PROVIDER_SITE_OTHER): Payer: Medicaid Other | Admitting: Orthopaedic Surgery

## 2019-01-03 ENCOUNTER — Ambulatory Visit (INDEPENDENT_AMBULATORY_CARE_PROVIDER_SITE_OTHER): Payer: Self-pay

## 2019-01-03 ENCOUNTER — Encounter (INDEPENDENT_AMBULATORY_CARE_PROVIDER_SITE_OTHER): Payer: Self-pay | Admitting: Orthopaedic Surgery

## 2019-01-03 VITALS — BP 127/76 | HR 90 | Temp 97.5°F | Ht 69.5 in | Wt 180.0 lb

## 2019-01-03 DIAGNOSIS — S72352D Displaced comminuted fracture of shaft of left femur, subsequent encounter for closed fracture with routine healing: Secondary | ICD-10-CM

## 2019-01-03 DIAGNOSIS — M898X5 Other specified disorders of bone, thigh: Secondary | ICD-10-CM
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
## 2019-03-07 ENCOUNTER — Ambulatory Visit (INDEPENDENT_AMBULATORY_CARE_PROVIDER_SITE_OTHER): Payer: Medicaid Other | Admitting: Orthopaedic Surgery

## 2019-03-15 ENCOUNTER — Ambulatory Visit (INDEPENDENT_AMBULATORY_CARE_PROVIDER_SITE_OTHER): Payer: Medicaid Other

## 2019-03-15 ENCOUNTER — Other Ambulatory Visit: Payer: Self-pay

## 2019-03-15 ENCOUNTER — Ambulatory Visit (INDEPENDENT_AMBULATORY_CARE_PROVIDER_SITE_OTHER): Payer: Medicaid Other | Admitting: Orthopaedic Surgery

## 2019-03-15 ENCOUNTER — Encounter (INDEPENDENT_AMBULATORY_CARE_PROVIDER_SITE_OTHER): Payer: Self-pay | Admitting: Orthopaedic Surgery

## 2019-03-15 VITALS — Ht 69.5 in | Wt 180.0 lb

## 2019-03-15 DIAGNOSIS — M7989 Other specified soft tissue disorders: Secondary | ICD-10-CM

## 2019-03-15 DIAGNOSIS — S72352D Displaced comminuted fracture of shaft of left femur, subsequent encounter for closed fracture with routine healing: Secondary | ICD-10-CM
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
## 2019-03-16 ENCOUNTER — Encounter (INDEPENDENT_AMBULATORY_CARE_PROVIDER_SITE_OTHER): Payer: Self-pay | Admitting: Orthopaedic Surgery

## 2019-03-16 DIAGNOSIS — M7989 Other specified soft tissue disorders: Secondary | ICD-10-CM | POA: Insufficient documentation

## 2019-03-20 ENCOUNTER — Telehealth (HOSPITAL_COMMUNITY): Payer: Self-pay | Admitting: Rehabilitation

## 2019-03-20 NOTE — Telephone Encounter (Signed)
The above patient or their representative was contacted and gave the following answers to these questions:         Do you have any of the following symptoms? No  Fever                    Cough                   Shortness of breath  Do  you have any of the following other symptoms? No   muscle pain         vomiting,        diarrhea        rash         weakness        red eye        abdominal pain         bruising          bruising or bleeding              joint pain           severe headache    Have you been in contact with someone who was or has been sick in the past 2 weeks? No  Yes                 Unsure                         Unable to assess   Does the person that you were in contact with have any of the following symptoms?   Cough         shortness of breath           muscle pain         vomiting,            diarrhea            rash            weakness           fever            red eye           abdominal pain           bruising  or  bleeding                joint pain                severe headache               Have you  or someone you have been in contact with traveled internationally in th last month? No        If yes, which countries?   Have you  or someone you have been in contact with traveled outside  in th last month? No         If yes, which state and city?   COMMENTS OR ACTION PLAN FOR THIS PATIENT:          

## 2019-03-21 ENCOUNTER — Telehealth (INDEPENDENT_AMBULATORY_CARE_PROVIDER_SITE_OTHER): Payer: Self-pay

## 2019-03-21 ENCOUNTER — Other Ambulatory Visit: Payer: Self-pay

## 2019-03-21 ENCOUNTER — Ambulatory Visit (HOSPITAL_COMMUNITY)
Admission: RE | Admit: 2019-03-21 | Discharge: 2019-03-21 | Disposition: A | Discharge status: Home / Self Care | Payer: Medicaid Other | Source: Ambulatory Visit | Attending: Orthopaedic Surgery | Admitting: Orthopaedic Surgery

## 2019-03-21 DIAGNOSIS — M7989 Other specified soft tissue disorders: Secondary | ICD-10-CM | POA: Diagnosis present

## 2019-03-21 DIAGNOSIS — S72352D Displaced comminuted fracture of shaft of left femur, subsequent encounter for closed fracture with routine healing: Secondary | ICD-10-CM | POA: Diagnosis not present

## 2019-03-21 NOTE — Telephone Encounter (Signed)
FYIMolli Hazard from vascular called and states patient is Neg for DVT and will fax report to [place].   CB: [phone]

## 2019-03-21 NOTE — Telephone Encounter (Signed)
Please see below.

## 2020-03-04 ENCOUNTER — Emergency Department
Admission: EM | Admit: 2020-03-04 | Discharge: 2020-03-04 | Disposition: A | Discharge status: Home / Self Care | Payer: Medicaid Other | Attending: Emergency Medicine | Admitting: Emergency Medicine

## 2020-03-04 ENCOUNTER — Other Ambulatory Visit: Payer: Self-pay

## 2020-03-04 DIAGNOSIS — I83892 Varicose veins of left lower extremities with other complications: Secondary | ICD-10-CM | POA: Insufficient documentation

## 2020-03-04 DIAGNOSIS — F17210 Nicotine dependence, cigarettes, uncomplicated: Secondary | ICD-10-CM | POA: Diagnosis not present

## 2020-03-04 DIAGNOSIS — S81812A Laceration without foreign body, left lower leg, initial encounter: Secondary | ICD-10-CM | POA: Diagnosis not present

## 2020-03-04 DIAGNOSIS — B1920 Unspecified viral hepatitis C without hepatic coma: Secondary | ICD-10-CM | POA: Diagnosis not present

## 2020-03-04 DIAGNOSIS — I83899 Varicose veins of unspecified lower extremities with other complications: Secondary | ICD-10-CM
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
## 2020-03-11 DIAGNOSIS — I890 Lymphedema, not elsewhere classified: Secondary | ICD-10-CM | POA: Diagnosis not present

## 2020-04-10 DIAGNOSIS — H5213 Myopia, bilateral: Secondary | ICD-10-CM | POA: Diagnosis not present

## 2020-04-15 DIAGNOSIS — H5213 Myopia, bilateral: Secondary | ICD-10-CM | POA: Diagnosis not present

## 2020-05-21 DIAGNOSIS — H52223 Regular astigmatism, bilateral: Secondary | ICD-10-CM | POA: Diagnosis not present

## 2020-11-11 DIAGNOSIS — K047 Periapical abscess without sinus: Secondary | ICD-10-CM | POA: Diagnosis not present

## 2020-11-11 DIAGNOSIS — R220 Localized swelling, mass and lump, head: Secondary | ICD-10-CM | POA: Diagnosis not present

## 2021-02-15 ENCOUNTER — Encounter: Payer: Self-pay | Admitting: Emergency Medicine

## 2021-02-15 ENCOUNTER — Other Ambulatory Visit: Payer: Self-pay

## 2021-02-15 ENCOUNTER — Emergency Department
Admission: EM | Admit: 2021-02-15 | Discharge: 2021-02-15 | Disposition: A | Discharge status: Home / Self Care | Payer: Medicaid Other | Attending: Emergency Medicine | Admitting: Emergency Medicine

## 2021-02-15 DIAGNOSIS — I509 Heart failure, unspecified: Secondary | ICD-10-CM | POA: Insufficient documentation

## 2021-02-15 DIAGNOSIS — J449 Chronic obstructive pulmonary disease, unspecified: Secondary | ICD-10-CM | POA: Diagnosis not present

## 2021-02-15 DIAGNOSIS — T1591XA Foreign body on external eye, part unspecified, right eye, initial encounter: Secondary | ICD-10-CM | POA: Diagnosis present

## 2021-02-15 DIAGNOSIS — J45909 Unspecified asthma, uncomplicated: Secondary | ICD-10-CM | POA: Insufficient documentation

## 2021-02-15 DIAGNOSIS — T1501XA Foreign body in cornea, right eye, initial encounter: Secondary | ICD-10-CM

## 2021-02-15 DIAGNOSIS — X58XXXA Exposure to other specified factors, initial encounter: Secondary | ICD-10-CM | POA: Diagnosis not present

## 2021-02-15 DIAGNOSIS — F17210 Nicotine dependence, cigarettes, uncomplicated: Secondary | ICD-10-CM | POA: Diagnosis not present

## 2021-02-15 MED ORDER — ERYTHROMYCIN 5 MG/GM OP OINT: 1.0000 "application " | TOPICAL_OINTMENT | Freq: Four times a day (QID) | OPHTHALMIC | Status: DC

## 2021-02-15 MED ORDER — FLUORESCEIN SODIUM 1 MG OP STRP
1.0000 | ORAL_STRIP | Freq: Once | OPHTHALMIC | Status: DC
  Filled 2021-02-15: qty 1

## 2021-02-15 MED ORDER — TETRACAINE HCL 0.5 % OP SOLN: 2.0000 [drp] | Freq: Once | OPHTHALMIC | Status: DC

## 2021-02-15 NOTE — ED Notes (Signed)
E-sig pad not working. D/c forms printed, signed and sent to HIM to be scanned into chart
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
## 2021-02-15 NOTE — Consult Note (Signed)
Subjective: # day h/o MFB od. Was grinding lawn mower blade with goggles. Pain but vision OK H/o myopia  H/o similar problem Objective: Vital signs in last 24 hours: Temp:  [97.9 F (36.6 C)] 97.9 F (36.6 C) (03/19 0736) Pulse Rate:  [65] 65 (03/19 0736) Resp:  [18] 18 (03/19 0736) BP: (121)/(77) 121/77 (03/19 0736) SpO2:  [99 %] 99 % (03/19 0736) Weight:  [99.8 kg] 99.8 kg (03/19 0734)    Exam : [facility]VA 20/20 OD   20/20 OS           OS normal anterior segment            OD 2 + inj.   MFB in supero-temporal aspect. Mild infiltrate. Quiet AC  No results for input(s): WBC, HGB, HCT, NA, K, CL, CO2, BUN, CREATININE, GLU in the last 72 hours.  Invalid input(s): PLATELETS  Studies/Results: No results found.   Assessment/Plan: Proparacaine applied. MFB removed with 25G  Rust burred. BCL placed with vigamox. Pt  To use Vigamox 4 times daily rtc at the [facility] 2 days.  LOS: 0 days   Galen Manila 3/19/20228:49 AM

## 2021-02-15 NOTE — Discharge Instructions (Signed)
Use antibiotic eyedrops as prescribed.  Wear eye protection as suggested.  Follow-up with Dr. Druscilla Brownie as scheduled.

## 2021-02-15 NOTE — ED Triage Notes (Signed)
Pt reports was grinding metal 2 days ago and got some of the metal shavings in his right eye. Pt reports thought he got them out but it feels like they are still in there.

## 2021-02-15 NOTE — ED Notes (Signed)
Pt verbalizes understanding of d/c instructions and follow up. 

## 2021-02-21 DIAGNOSIS — T1501XD Foreign body in cornea, right eye, subsequent encounter: Secondary | ICD-10-CM | POA: Diagnosis not present

## 2021-06-08 DIAGNOSIS — K047 Periapical abscess without sinus: Secondary | ICD-10-CM | POA: Diagnosis not present

## 2021-06-28 ENCOUNTER — Other Ambulatory Visit: Payer: Self-pay

## 2021-06-28 ENCOUNTER — Emergency Department: Payer: Medicaid Other

## 2021-06-28 ENCOUNTER — Emergency Department
Admission: EM | Admit: 2021-06-28 | Discharge: 2021-06-28 | Disposition: A | Discharge status: Home / Self Care | Payer: Medicaid Other | Attending: Emergency Medicine | Admitting: Emergency Medicine

## 2021-06-28 DIAGNOSIS — S52612A Displaced fracture of left ulna styloid process, initial encounter for closed fracture: Secondary | ICD-10-CM | POA: Diagnosis not present

## 2021-06-28 DIAGNOSIS — Q7192 Unspecified reduction defect of left upper limb: Secondary | ICD-10-CM | POA: Diagnosis not present

## 2021-06-28 DIAGNOSIS — J45909 Unspecified asthma, uncomplicated: Secondary | ICD-10-CM | POA: Diagnosis not present

## 2021-06-28 DIAGNOSIS — S59292A Other physeal fracture of lower end of radius, left arm, initial encounter for closed fracture: Secondary | ICD-10-CM | POA: Diagnosis not present

## 2021-06-28 DIAGNOSIS — S52602A Unspecified fracture of lower end of left ulna, initial encounter for closed fracture: Secondary | ICD-10-CM | POA: Insufficient documentation

## 2021-06-28 DIAGNOSIS — W010XXA Fall on same level from slipping, tripping and stumbling without subsequent striking against object, initial encounter: Secondary | ICD-10-CM | POA: Diagnosis not present

## 2021-06-28 DIAGNOSIS — J449 Chronic obstructive pulmonary disease, unspecified: Secondary | ICD-10-CM | POA: Insufficient documentation

## 2021-06-28 DIAGNOSIS — I509 Heart failure, unspecified: Secondary | ICD-10-CM | POA: Insufficient documentation

## 2021-06-28 DIAGNOSIS — F17210 Nicotine dependence, cigarettes, uncomplicated: Secondary | ICD-10-CM | POA: Diagnosis not present

## 2021-06-28 DIAGNOSIS — S52502A Unspecified fracture of the lower end of left radius, initial encounter for closed fracture: Secondary | ICD-10-CM | POA: Diagnosis not present

## 2021-06-28 DIAGNOSIS — S6992XA Unspecified injury of left wrist, hand and finger(s), initial encounter: Secondary | ICD-10-CM | POA: Diagnosis present

## 2021-06-28 MED ORDER — KETOROLAC TROMETHAMINE 30 MG/ML IJ SOLN
60.0000 mg | Freq: Once | INTRAMUSCULAR | Status: AC
  Administered 2021-06-28: 60 mg via INTRAMUSCULAR
  Filled 2021-06-28: qty 2

## 2021-06-28 MED ORDER — BUPIVACAINE HCL (PF) 0.5 % IJ SOLN
20.0000 mL | Freq: Once | INTRAMUSCULAR | Status: AC
  Administered 2021-06-28: 20 mL
  Filled 2021-06-28: qty 30
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
## 2021-06-28 NOTE — ED Triage Notes (Signed)
Pt states fell injuring left arm tonight. Pt with pain from distal radius to wrist, cms intact, ice applied in triage.

## 2021-07-03 DIAGNOSIS — S52502A Unspecified fracture of the lower end of left radius, initial encounter for closed fracture: Secondary | ICD-10-CM | POA: Diagnosis not present

## 2021-07-08 DIAGNOSIS — S52502A Unspecified fracture of the lower end of left radius, initial encounter for closed fracture: Secondary | ICD-10-CM | POA: Diagnosis not present

## 2021-07-08 DIAGNOSIS — Z09 Encounter for follow-up examination after completed treatment for conditions other than malignant neoplasm: Secondary | ICD-10-CM | POA: Diagnosis not present

## 2021-07-10 DIAGNOSIS — S52572A Other intraarticular fracture of lower end of left radius, initial encounter for closed fracture: Secondary | ICD-10-CM | POA: Diagnosis not present

## 2021-07-10 DIAGNOSIS — S52592A Other fractures of lower end of left radius, initial encounter for closed fracture: Secondary | ICD-10-CM | POA: Diagnosis not present

## 2021-07-10 DIAGNOSIS — G8918 Other acute postprocedural pain: Secondary | ICD-10-CM | POA: Diagnosis not present

## 2021-07-10 DIAGNOSIS — M25532 Pain in left wrist: Secondary | ICD-10-CM | POA: Diagnosis not present

## 2021-07-18 DIAGNOSIS — S52512D Displaced fracture of left radial styloid process, subsequent encounter for closed fracture with routine healing: Secondary | ICD-10-CM | POA: Diagnosis not present

## 2021-07-22 DIAGNOSIS — Z09 Encounter for follow-up examination after completed treatment for conditions other than malignant neoplasm: Secondary | ICD-10-CM | POA: Diagnosis not present

## 2021-07-24 DIAGNOSIS — Z7689 Persons encountering health services in other specified circumstances: Secondary | ICD-10-CM | POA: Diagnosis not present

## 2021-07-24 DIAGNOSIS — Z09 Encounter for follow-up examination after completed treatment for conditions other than malignant neoplasm: Secondary | ICD-10-CM | POA: Diagnosis not present

## 2021-07-24 DIAGNOSIS — S52512D Displaced fracture of left radial styloid process, subsequent encounter for closed fracture with routine healing: Secondary | ICD-10-CM | POA: Diagnosis not present

## 2021-07-31 DIAGNOSIS — S52512D Displaced fracture of left radial styloid process, subsequent encounter for closed fracture with routine healing: Secondary | ICD-10-CM | POA: Diagnosis not present

## 2021-08-19 DIAGNOSIS — S52512D Displaced fracture of left radial styloid process, subsequent encounter for closed fracture with routine healing: Secondary | ICD-10-CM | POA: Diagnosis not present

## 2021-08-25 DIAGNOSIS — E559 Vitamin D deficiency, unspecified: Secondary | ICD-10-CM | POA: Diagnosis not present

## 2021-08-25 DIAGNOSIS — R609 Edema, unspecified: Secondary | ICD-10-CM | POA: Diagnosis not present

## 2021-08-25 DIAGNOSIS — T7840XD Allergy, unspecified, subsequent encounter: Secondary | ICD-10-CM | POA: Diagnosis not present

## 2021-08-25 DIAGNOSIS — Z125 Encounter for screening for malignant neoplasm of prostate: Secondary | ICD-10-CM | POA: Diagnosis not present

## 2021-08-25 DIAGNOSIS — E785 Hyperlipidemia, unspecified: Secondary | ICD-10-CM | POA: Diagnosis not present

## 2021-08-25 DIAGNOSIS — R5383 Other fatigue: Secondary | ICD-10-CM | POA: Diagnosis not present

## 2021-09-16 ENCOUNTER — Encounter: Payer: Self-pay | Admitting: *Deleted

## 2021-09-16 ENCOUNTER — Other Ambulatory Visit: Payer: Self-pay

## 2021-09-16 ENCOUNTER — Emergency Department
Admission: EM | Admit: 2021-09-16 | Discharge: 2021-09-16 | Disposition: A | Discharge status: Home / Self Care | Payer: Medicaid Other | Attending: Emergency Medicine | Admitting: Emergency Medicine

## 2021-09-16 ENCOUNTER — Emergency Department: Payer: Medicaid Other

## 2021-09-16 DIAGNOSIS — W1830XA Fall on same level, unspecified, initial encounter: Secondary | ICD-10-CM | POA: Diagnosis not present

## 2021-09-16 DIAGNOSIS — F17210 Nicotine dependence, cigarettes, uncomplicated: Secondary | ICD-10-CM | POA: Insufficient documentation

## 2021-09-16 DIAGNOSIS — I509 Heart failure, unspecified: Secondary | ICD-10-CM | POA: Diagnosis not present

## 2021-09-16 DIAGNOSIS — J45909 Unspecified asthma, uncomplicated: Secondary | ICD-10-CM | POA: Insufficient documentation

## 2021-09-16 DIAGNOSIS — J449 Chronic obstructive pulmonary disease, unspecified: Secondary | ICD-10-CM | POA: Insufficient documentation

## 2021-09-16 DIAGNOSIS — S82831A Other fracture of upper and lower end of right fibula, initial encounter for closed fracture: Secondary | ICD-10-CM | POA: Insufficient documentation

## 2021-09-16 DIAGNOSIS — R600 Localized edema: Secondary | ICD-10-CM | POA: Diagnosis not present

## 2021-09-16 DIAGNOSIS — Y9301 Activity, walking, marching and hiking: Secondary | ICD-10-CM | POA: Insufficient documentation

## 2021-09-16 DIAGNOSIS — M79604 Pain in right leg: Secondary | ICD-10-CM

## 2021-09-16 DIAGNOSIS — S8991XA Unspecified injury of right lower leg, initial encounter: Secondary | ICD-10-CM | POA: Diagnosis present

## 2021-09-16 MED ORDER — ONDANSETRON 4 MG PO TBDP: 4.0000 mg | ORAL_TABLET | Freq: Three times a day (TID) | ORAL | 0 refills | Status: AC | PRN

## 2021-09-16 MED ORDER — OXYCODONE-ACETAMINOPHEN 5-325 MG PO TABS: 1.0000 | ORAL_TABLET | Freq: Four times a day (QID) | ORAL | 0 refills | Status: AC | PRN

## 2021-09-16 NOTE — Discharge Instructions (Addendum)
Please make follow up appointment with ortho.  You can take take Percocet for pain. Please take stool softener with Percocet.
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
## 2021-09-16 NOTE — ED Notes (Signed)
See triage note  presents with pain to right leg  states was walking on trailer

## 2021-09-16 NOTE — ED Triage Notes (Signed)
Pt has pain in right lower leg.  Pt was walking on a trailer and fell threw the trailer.  Pt reports pain with ambulation.  Pt alert.

## 2021-09-18 DIAGNOSIS — S82401A Unspecified fracture of shaft of right fibula, initial encounter for closed fracture: Secondary | ICD-10-CM | POA: Diagnosis not present

## 2021-09-18 DIAGNOSIS — M79604 Pain in right leg: Secondary | ICD-10-CM | POA: Diagnosis not present

## 2021-11-17 ENCOUNTER — Other Ambulatory Visit: Payer: Self-pay | Admitting: Internal Medicine

## 2021-11-17 DIAGNOSIS — M25571 Pain in right ankle and joints of right foot: Secondary | ICD-10-CM

## 2021-11-19 ENCOUNTER — Other Ambulatory Visit: Payer: Self-pay

## 2021-11-19 ENCOUNTER — Ambulatory Visit
Admission: RE | Admit: 2021-11-19 | Discharge: 2021-11-19 | Disposition: A | Discharge status: Home / Self Care | Payer: Medicaid Other | Source: Ambulatory Visit | Attending: Internal Medicine | Admitting: Internal Medicine

## 2021-11-19 ENCOUNTER — Other Ambulatory Visit: Payer: Self-pay | Admitting: Internal Medicine

## 2021-11-19 ENCOUNTER — Ambulatory Visit: Admit: 2021-11-19 | Payer: Medicaid Other

## 2021-11-19 ENCOUNTER — Ambulatory Visit
Admission: RE | Admit: 2021-11-19 | Discharge: 2021-11-19 | Disposition: A | Discharge status: Home / Self Care | Payer: Medicaid Other | Source: Ambulatory Visit

## 2021-11-19 ENCOUNTER — Ambulatory Visit
Admission: RE | Admit: 2021-11-19 | Discharge: 2021-11-19 | Disposition: A | Discharge status: Home / Self Care | Payer: Medicaid Other | Attending: Internal Medicine | Admitting: Internal Medicine

## 2021-11-19 DIAGNOSIS — M25571 Pain in right ankle and joints of right foot: Secondary | ICD-10-CM

## 2021-11-19 DIAGNOSIS — R609 Edema, unspecified: Secondary | ICD-10-CM | POA: Diagnosis not present

## 2021-11-19 DIAGNOSIS — R52 Pain, unspecified: Secondary | ICD-10-CM

## 2021-11-19 DIAGNOSIS — M25471 Effusion, right ankle: Secondary | ICD-10-CM | POA: Insufficient documentation

## 2021-11-20 ENCOUNTER — Ambulatory Visit: Admission: RE | Admit: 2021-11-20 | Payer: Medicaid Other | Source: Ambulatory Visit

## 2021-12-03 DIAGNOSIS — S86311A Strain of muscle(s) and tendon(s) of peroneal muscle group at lower leg level, right leg, initial encounter: Secondary | ICD-10-CM | POA: Diagnosis not present

## 2021-12-03 DIAGNOSIS — M7671 Peroneal tendinitis, right leg: Secondary | ICD-10-CM | POA: Diagnosis not present

## 2022-01-09 DIAGNOSIS — M67969 Unspecified disorder of synovium and tendon, unspecified lower leg: Secondary | ICD-10-CM | POA: Diagnosis not present

## 2022-01-14 DIAGNOSIS — M25571 Pain in right ankle and joints of right foot: Secondary | ICD-10-CM | POA: Diagnosis not present

## 2022-01-14 DIAGNOSIS — M25671 Stiffness of right ankle, not elsewhere classified: Secondary | ICD-10-CM | POA: Diagnosis not present

## 2022-02-06 IMAGING — CR DG WRIST COMPLETE 3+V*L*
3 series · 3 of 3 positions shown · non-contrast
Comparison: None.

CLINICAL DATA: Pt states fell injuring left arm tonight. Pt with
pain from distal radius to wrist, cms intact, ice applied in triage

EXAM:
LEFT WRIST - COMPLETE 3+ VIEW; LEFT FOREARM - 2 VIEW

[wrist pa]
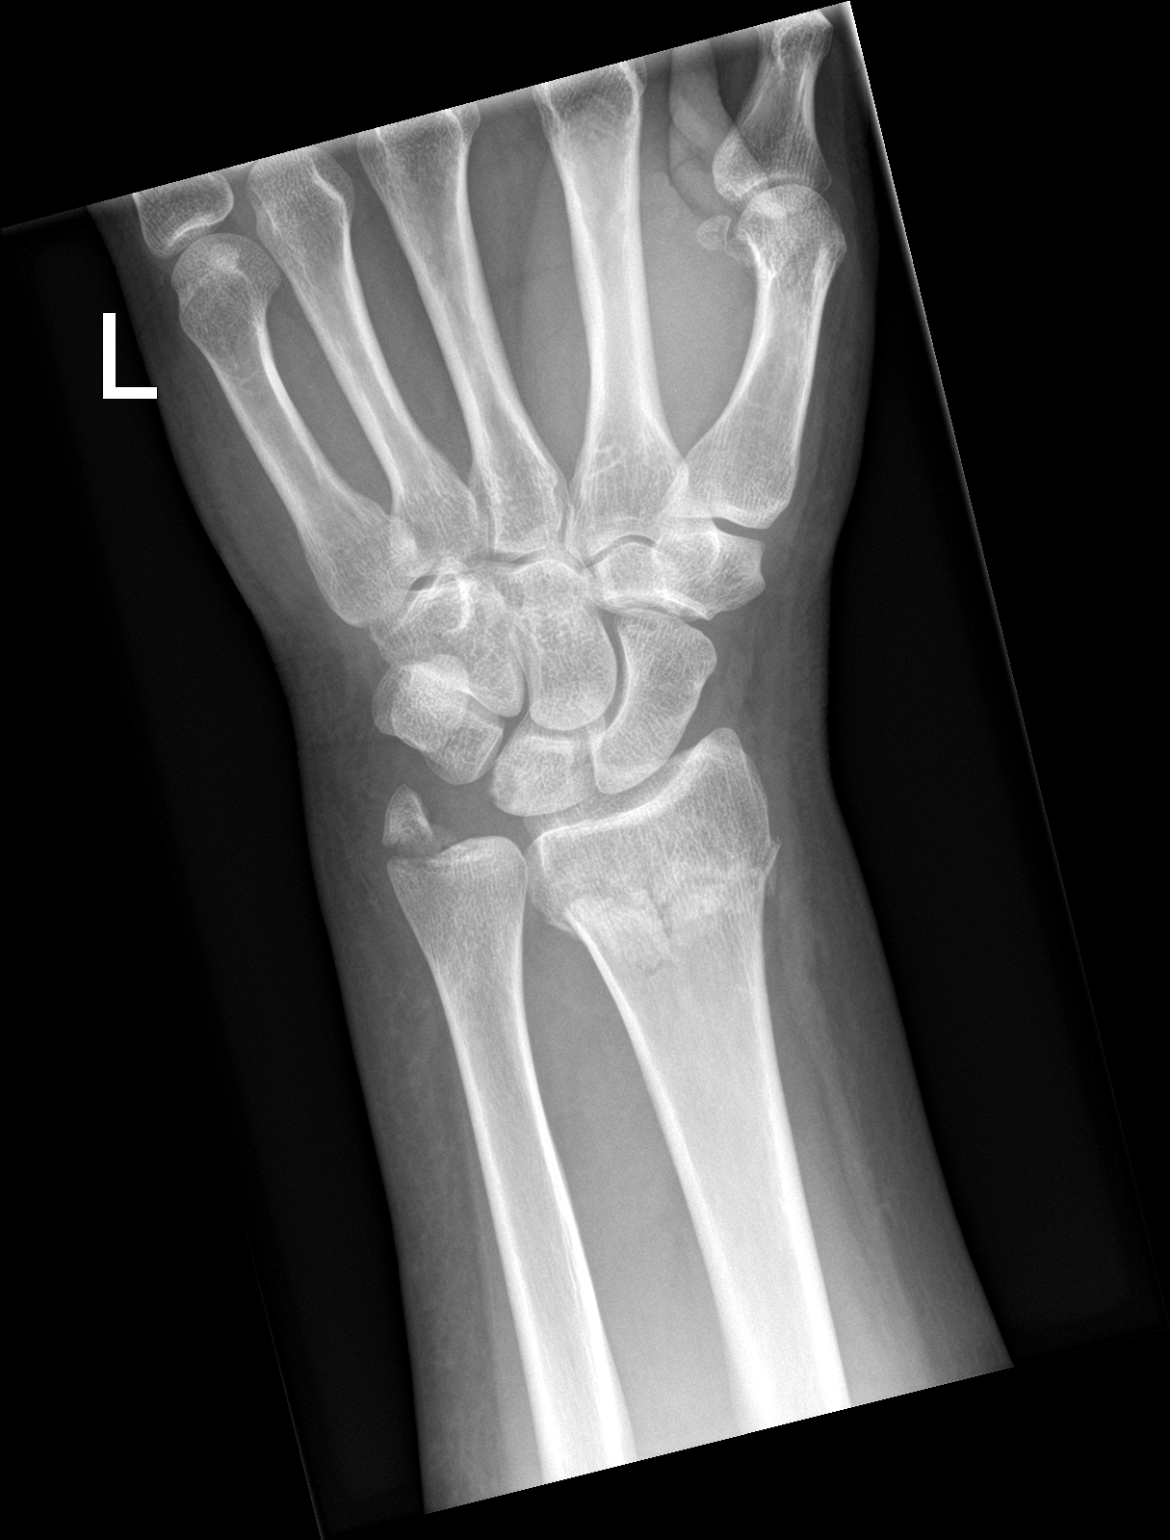

[wrist obl]
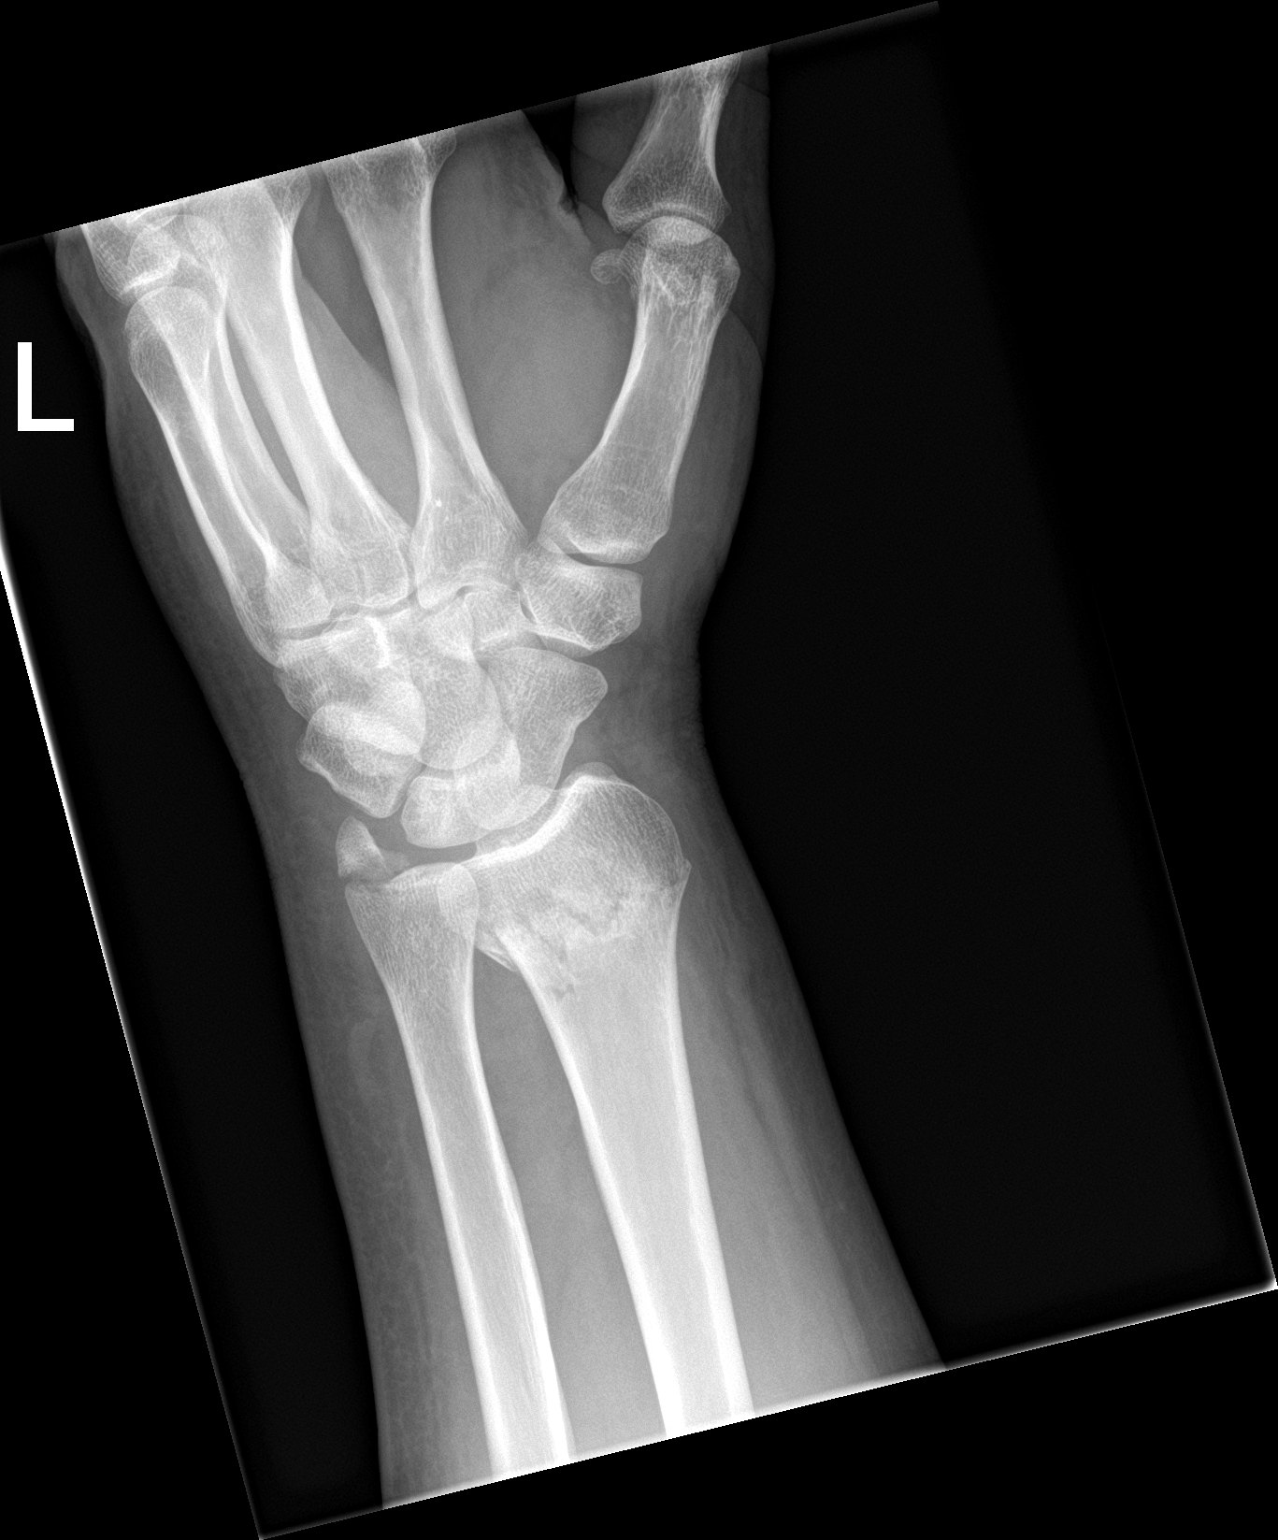

[wrist lat]
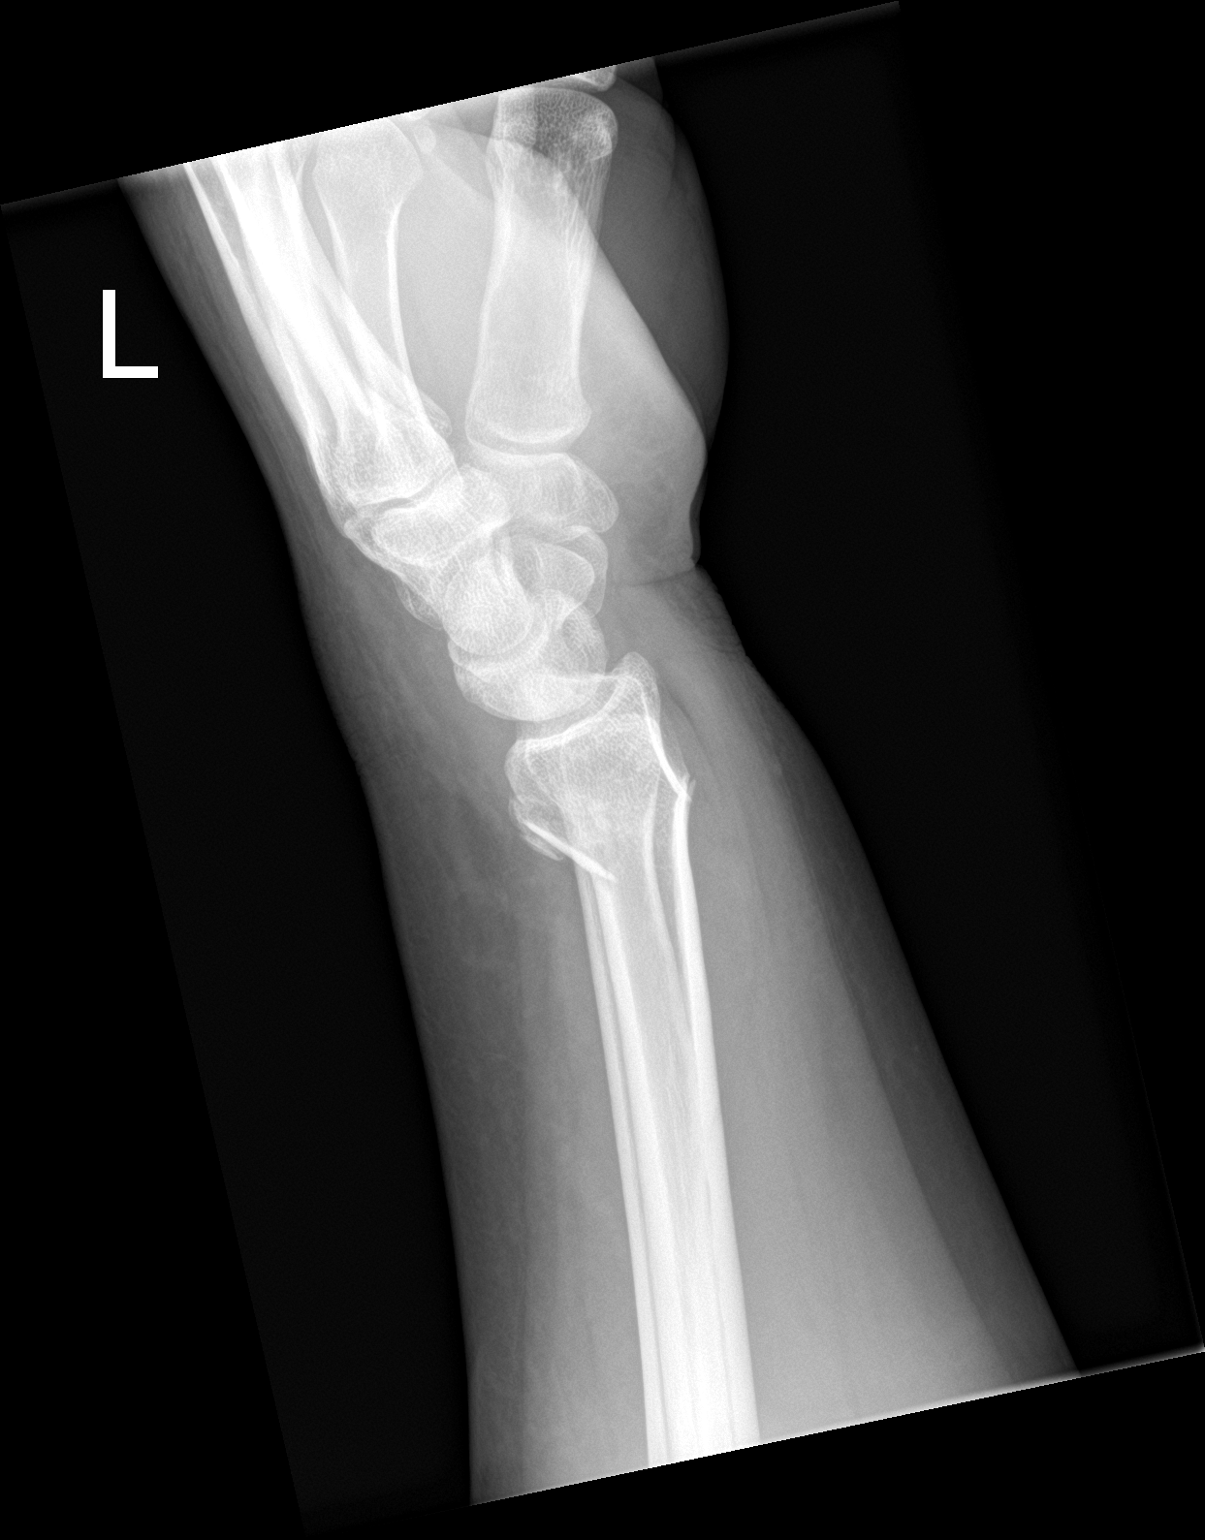

[3 of 3 positions shown; findings below may reference images not displayed]

FINDINGS: Markedly comminuted, impacted, minimally displaced, dorsally
angulated metaphyseal distal radial fracture.

Minimally displaced and slightly angulated ulnar styloid fracture.

No other acute fracture of the bones of the left breast.

The proximal radius and ulna demonstrate no acute displaced
fracture. Elbow grossly unremarkable.
IMPRESSION: 1. Markedly comminuted, impacted, minimally displaced, dorsally
angulated metaphyseal distal radial fracture.
2. Minimally displaced and slightly angulated ulnar styloid
fracture.
3. No proximal forearm fracture.

## 2022-04-27 IMAGING — CR DG TIBIA/FIBULA 2V*R*
1 series · 4 of 4 positions shown · non-contrast
Comparison: None.

CLINICAL DATA: Lower leg injury

EXAM:
RIGHT TIBIA AND FIBULA - 2 VIEW

[Series 1: dg tibia/fibula right · 0.14mm/px · 4 of 4 slices shown]
[im 1/4]
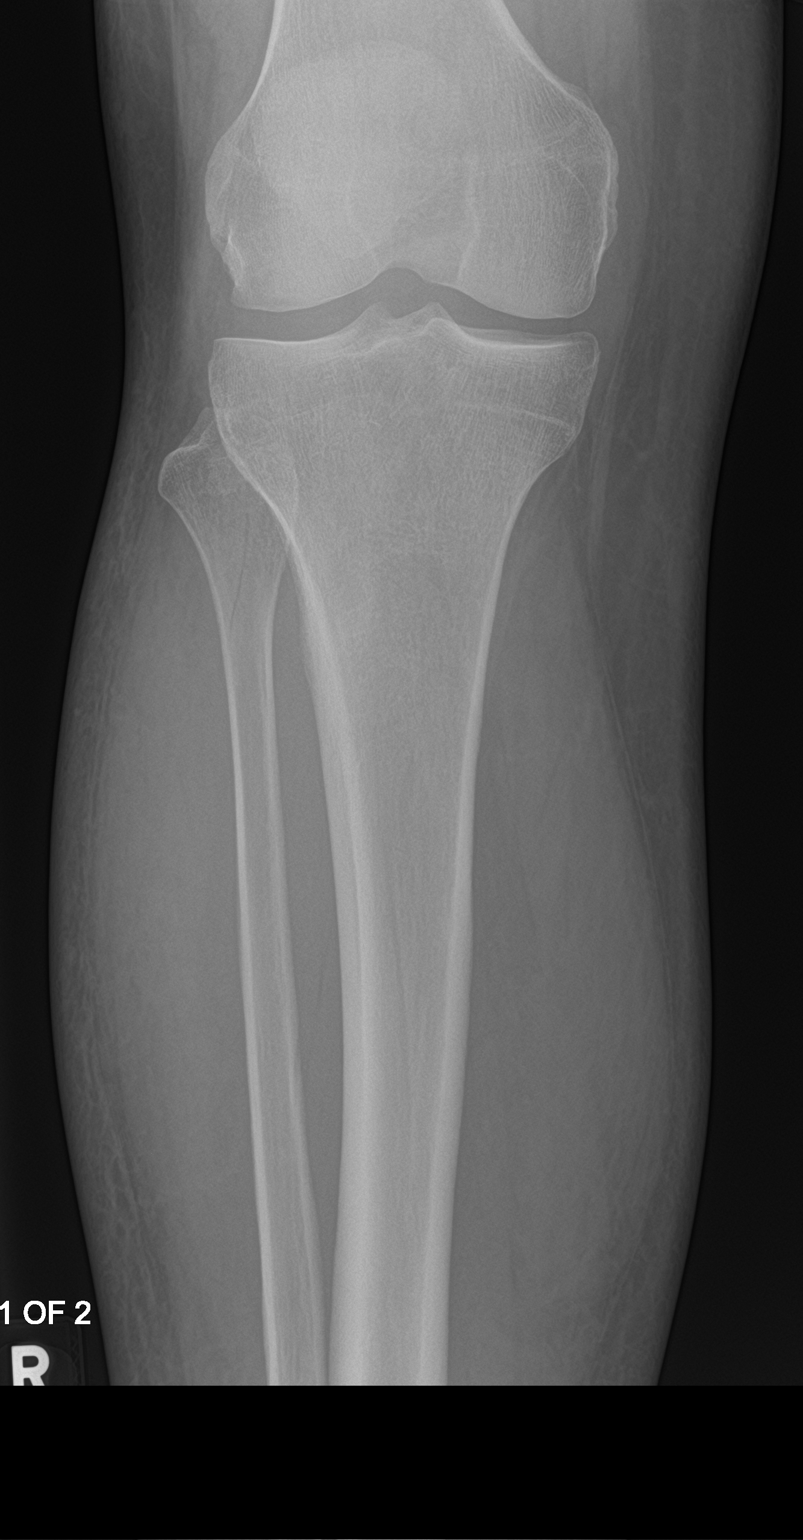
[im 2/4]
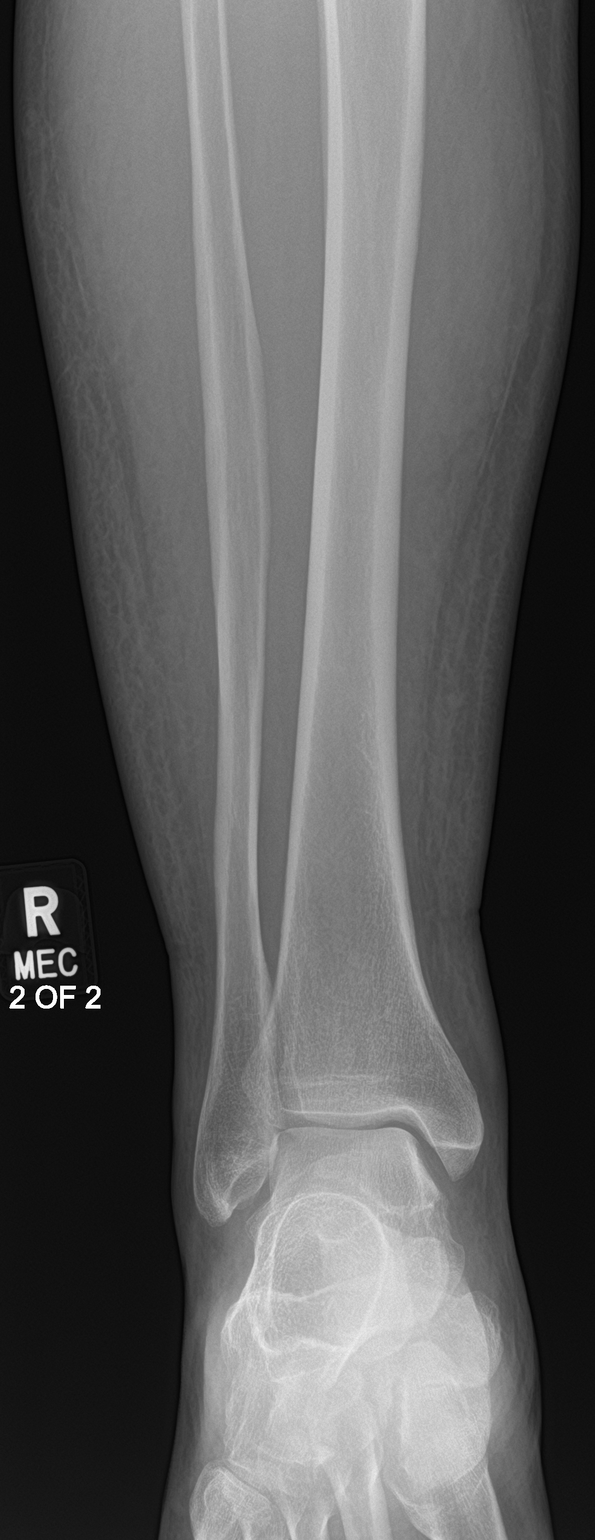
[im 3/4]
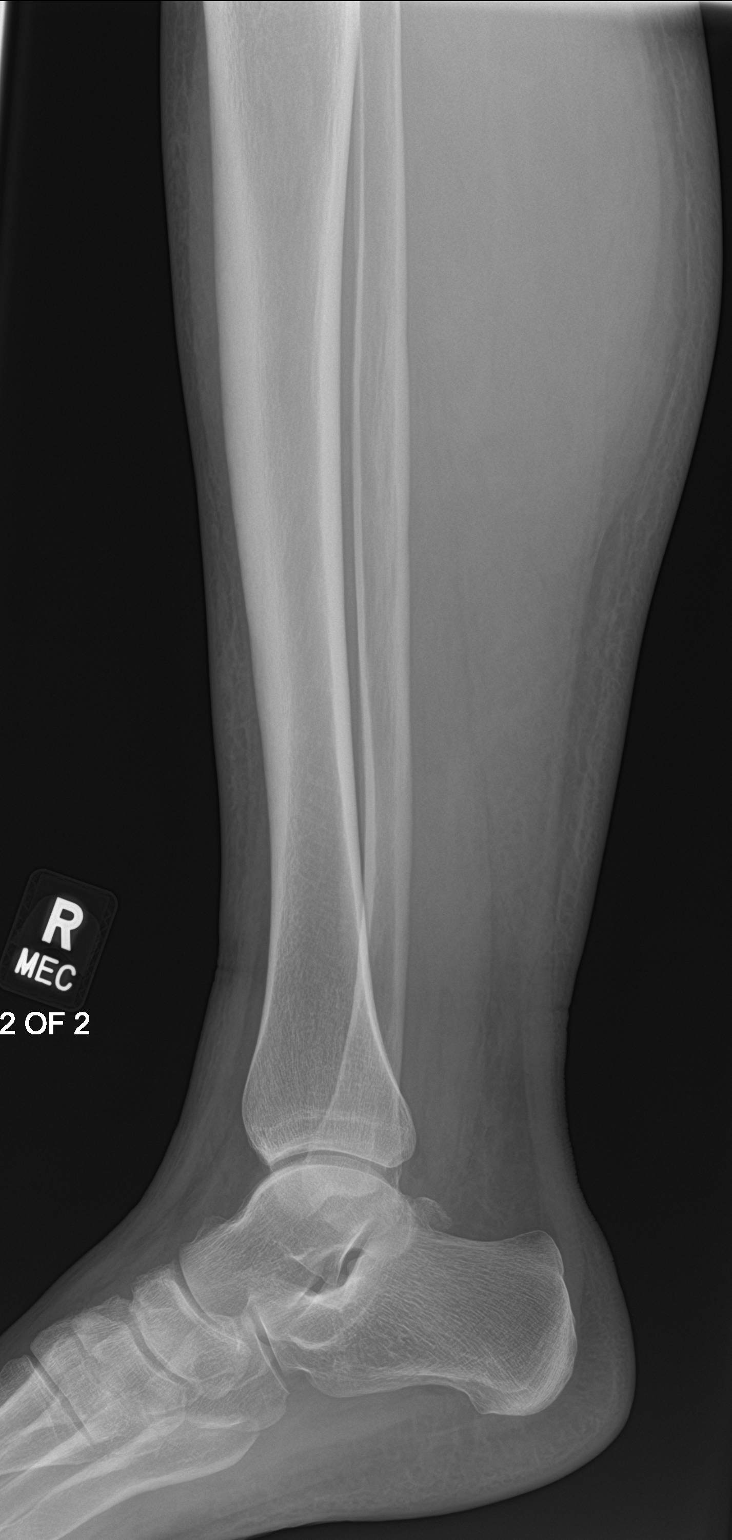
[im 4/4]
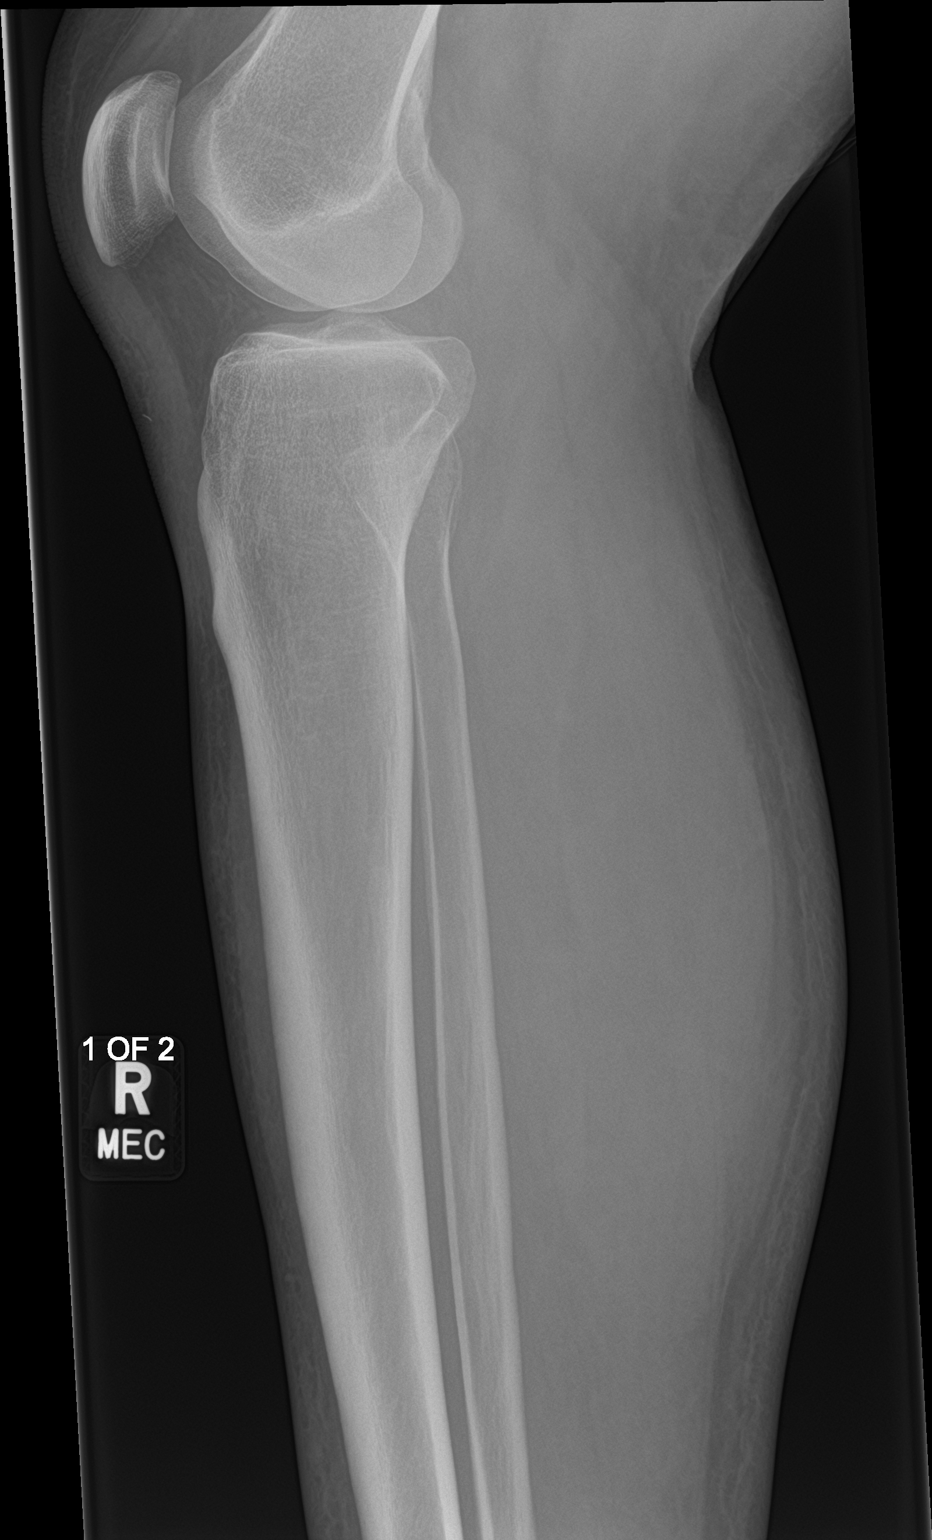

[4 of 4 positions shown; findings below may reference images not displayed]

FINDINGS: No malalignment. Acute nondisplaced fracture involving the proximal
fibula. Soft tissue edema. No radiopaque foreign body.
IMPRESSION: Acute nondisplaced proximal fibular fracture.

## 2022-05-15 ENCOUNTER — Other Ambulatory Visit: Payer: Self-pay

## 2022-05-15 ENCOUNTER — Encounter: Payer: Self-pay | Admitting: Intensive Care

## 2022-05-15 ENCOUNTER — Emergency Department
Admission: EM | Admit: 2022-05-15 | Discharge: 2022-05-15 | Disposition: A | Discharge status: Home / Self Care | Payer: Medicaid Other | Attending: Emergency Medicine | Admitting: Emergency Medicine

## 2022-05-15 DIAGNOSIS — L0231 Cutaneous abscess of buttock: Secondary | ICD-10-CM

## 2022-05-15 DIAGNOSIS — R600 Localized edema: Secondary | ICD-10-CM | POA: Diagnosis not present

## 2022-05-15 DIAGNOSIS — M7989 Other specified soft tissue disorders: Secondary | ICD-10-CM | POA: Diagnosis present

## 2022-05-15 LAB — CBC WITH DIFFERENTIAL/PLATELET
Abs Immature Granulocytes: 0.01 10*3/uL (ref 0.00–0.07)
Basophils Absolute: 0.1 10*3/uL (ref 0.0–0.1)
Basophils Relative: 1 %
Eosinophils Absolute: 0.2 10*3/uL (ref 0.0–0.5)
Eosinophils Relative: 3 %
HCT: 39.8 % (ref 39.0–52.0)
Hemoglobin: 12.9 g/dL — ABNORMAL LOW (ref 13.0–17.0)
Immature Granulocytes: 0 %
Lymphocytes Relative: 30 %
Lymphs Abs: 1.9 10*3/uL (ref 0.7–4.0)
MCH: 30.6 pg (ref 26.0–34.0)
MCHC: 32.4 g/dL (ref 30.0–36.0)
MCV: 94.3 fL (ref 80.0–100.0)
Monocytes Absolute: 0.9 10*3/uL (ref 0.1–1.0)
Monocytes Relative: 13 %
Neutro Abs: 3.5 10*3/uL (ref 1.7–7.7)
Neutrophils Relative %: 53 %
Platelets: 229 10*3/uL (ref 150–400)
RBC: 4.22 MIL/uL (ref 4.22–5.81)
RDW: 13.2 % (ref 11.5–15.5)
WBC: 6.6 10*3/uL (ref 4.0–10.5)
nRBC: 0 % (ref 0.0–0.2)

## 2022-05-15 LAB — URINALYSIS, ROUTINE W REFLEX MICROSCOPIC
Bilirubin Urine: NEGATIVE
Glucose, UA: NEGATIVE mg/dL
Hgb urine dipstick: NEGATIVE
Ketones, ur: NEGATIVE mg/dL
Leukocytes,Ua: NEGATIVE
Nitrite: NEGATIVE
Protein, ur: NEGATIVE mg/dL
Specific Gravity, Urine: 1 — ABNORMAL LOW (ref 1.005–1.030)
pH: 6 (ref 5.0–8.0)

## 2022-05-15 LAB — COMPREHENSIVE METABOLIC PANEL
ALT: 81 U/L — ABNORMAL HIGH (ref 0–44)
AST: 65 U/L — ABNORMAL HIGH (ref 15–41)
Albumin: 3.5 g/dL (ref 3.5–5.0)
Alkaline Phosphatase: 72 U/L (ref 38–126)
Anion gap: 5 (ref 5–15)
BUN: 13 mg/dL (ref 6–20)
CO2: 30 mmol/L (ref 22–32)
Calcium: 8.8 mg/dL — ABNORMAL LOW (ref 8.9–10.3)
Chloride: 103 mmol/L (ref 98–111)
Creatinine, Ser: 0.87 mg/dL (ref 0.61–1.24)
GFR, Estimated: >60 mL/min (ref >60)
Glucose, Bld: 87 mg/dL (ref 70–99)
Potassium: 3.7 mmol/L (ref 3.5–5.1)
Sodium: 138 mmol/L (ref 135–145)
Total Bilirubin: 0.6 mg/dL (ref 0.3–1.2)
Total Protein: 7.6 g/dL (ref 6.5–8.1)

## 2022-05-15 LAB — LIPID PANEL
Cholesterol: 80 mg/dL (ref 0–200)
HDL: 32 mg/dL — ABNORMAL LOW (ref >40)
LDL Cholesterol: 39 mg/dL (ref 0–99)
Total CHOL/HDL Ratio: 2.5 RATIO
Triglycerides: 43 mg/dL (ref <150)
VLDL: 9 mg/dL (ref 0–40)

## 2022-05-15 LAB — TSH: TSH: 3.032 u[IU]/mL (ref 0.350–4.500)

## 2022-05-15 LAB — BRAIN NATRIURETIC PEPTIDE: B Natriuretic Peptide: 28.6 pg/mL (ref 0.0–100.0)

## 2022-05-15 MED ORDER — LIDOCAINE-EPINEPHRINE 2 %-1:100000 IJ SOLN
20.0000 mL | Freq: Once | INTRAMUSCULAR | Status: AC
  Administered 2022-05-15: 20 mL via INTRADERMAL
  Filled 2022-05-15: qty 1

## 2022-05-15 MED ORDER — DOXYCYCLINE HYCLATE 50 MG PO CAPS: 100.0000 mg | ORAL_CAPSULE | Freq: Two times a day (BID) | ORAL | 0 refills | Status: AC

## 2022-05-15 NOTE — ED Triage Notes (Signed)
Patient c/o bilateral leg swelling/redness for over 3 months and right side abscess on buttocks x5 days. Reports he has seen pcp for leg swelling but has not been giving any medication nor is it getting better.

## 2022-05-15 NOTE — ED Notes (Signed)
Pt in bed, pt states that he is ready to go home, verbalized understanding d/c instructions and follow up, reviewed script, pt from dpt.

## 2022-05-15 NOTE — ED Notes (Signed)
Pt standing in room, pt c/o an abscess and redness to his buttock.  Resps even and unlabored, pt states that his pmd also wanted to have some blood drawn and is asking if we can draw that.

## 2022-05-15 NOTE — ED Notes (Signed)
Md at bedside assessing pt and placing iv, number 20 gauge iv placed via ultrasound by provider.

## 2022-05-15 NOTE — ED Provider Notes (Signed)
[facility] Provider Note   Event Date/Time   First MD Initiated Contact with Patient 05/15/22 1041     (approximate) History  Leg Swelling (bilateral) and Abscess (buttocks)  HPI Aaron Young is a 41 y.o. male  Location: Right buttock Duration: 1 week prior to arrival Timing: Mildly improved over the last couple days Severity: 6/10 Quality: Aching burning pain Context: Patient states that he began with a small red lump to the right buttock that eventually opened up and was draining purulent fluid but has now scabbed over and still hurts Modifying factors: Sitting partially worsens this pain he denies any relieving factors Associated Symptoms: Bilateral lower extremity edema ROS: Patient currently denies any vision changes, tinnitus, difficulty speaking, facial droop, sore throat, chest pain, shortness of breath, abdominal pain, nausea/vomiting/diarrhea, dysuria, or weakness/numbness/paresthesias in any extremity   Physical Exam  Triage Vital Signs: ED Triage Vitals  Enc Vitals Group     BP 05/15/22 1033 (!) 166/113     Pulse Rate 05/15/22 1033 75     Resp 05/15/22 1033 16     Temp 05/15/22 1033 98.4 F (36.9 C)     Temp Source 05/15/22 1033 Oral     SpO2 05/15/22 1033 100 %     Weight 05/15/22 1035 235 lb 3.2 oz (106.7 kg)     Height 05/15/22 1035 5\' 10"  (1.778 m)     Head Circumference --      Peak Flow --      Pain Score 05/15/22 1034 6     Pain Loc --      Pain Edu? --      Excl. in GC? --    Most recent vital signs: Vitals:   05/15/22 1330 05/15/22 1424  BP: 112/77 (!) 131/100  Pulse: 83 64  Resp:  18  Temp:  97.8 F (36.6 C)  SpO2: 96% 98%   General: Awake, oriented x4. CV:  Good peripheral perfusion.  Resp:  Normal effort.  Abd:  No distention.  Other:  Middle-aged Caucasian overweight male sitting in bed in no acute distress.  Large area of erythema with central eschar to the mid aspect of the right buttock.  Bilateral  lower extremity pitting edema +1 ED Results / Procedures / Treatments  Labs (all labs ordered are listed, but only abnormal results are displayed) Labs Reviewed  CBC WITH DIFFERENTIAL/PLATELET - Abnormal; Notable for the following components:      Result Value   Hemoglobin 12.9 (*)    All other components within normal limits  COMPREHENSIVE METABOLIC PANEL - Abnormal; Notable for the following components:   Calcium 8.8 (*)    AST 65 (*)    ALT 81 (*)    All other components within normal limits  URINALYSIS, ROUTINE W REFLEX MICROSCOPIC - Abnormal; Notable for the following components:   Color, Urine COLORLESS (*)    APPearance CLEAR (*)    Specific Gravity, Urine 1.000 (*)    All other components within normal limits  BRAIN NATRIURETIC PEPTIDE  PROCEDURES: Critical Care performed: No .1-3 Lead EKG Interpretation  Performed by: 04-23-1979, MD Authorized by: Merwyn Katos, MD     Interpretation: normal     ECG rate:  63   ECG rate assessment: normal     Rhythm: sinus rhythm     Ectopy: none     Conduction: normal    MEDICATIONS ORDERED IN ED: Medications  lidocaine-EPINEPHrine (XYLOCAINE W/EPI) 2 %-1:100000 (with pres)  injection 20 mL (20 mLs Intradermal Given by Other 05/15/22 1148)   IMPRESSION / MDM / ASSESSMENT AND PLAN / ED COURSE  I reviewed the triage vital signs and the nursing notes.                             The patient is on the cardiac monitor to evaluate for evidence of arrhythmia and/or significant heart rate changes. Patient's presentation is most consistent with acute presentation with potential threat to life or bodily function. Presentation most consistent with simple Abscess. Given History, Exam, and Workup I have low suspicion for Cellulitis, Necrotizing Fasciitis, Pyomyositis, Sporotrichosis, Osteomyelitis or other emergent problem as a cause for this presentation. Interventions: none necessary as wound is already open and draining Rx:  Doxycycline 100 mg BID x 5 days Disposition: Patient is stable for discharge, advised to follow up with primary care physician in 48 hours.   FINAL CLINICAL IMPRESSION(S) / ED DIAGNOSES   Final diagnoses:  Bilateral lower extremity edema  Abscess of buttock, right   Rx / DC Orders   ED Discharge Orders          Ordered    doxycycline (VIBRAMYCIN) 50 MG capsule  2 times daily        05/15/22 1429           Note:  This document was prepared using Dragon voice recognition software and may include unintentional dictation errors.   Merwyn Katos, MD 05/15/22 1530

## 2022-12-29 ENCOUNTER — Other Ambulatory Visit (INDEPENDENT_AMBULATORY_CARE_PROVIDER_SITE_OTHER): Payer: Self-pay | Admitting: Nurse Practitioner

## 2022-12-29 DIAGNOSIS — I83001 Varicose veins of unspecified lower extremity with ulcer of thigh: Secondary | ICD-10-CM

## 2022-12-29 DIAGNOSIS — R609 Edema, unspecified: Secondary | ICD-10-CM

## 2022-12-30 ENCOUNTER — Ambulatory Visit (INDEPENDENT_AMBULATORY_CARE_PROVIDER_SITE_OTHER): Payer: Medicaid Other | Admitting: Nurse Practitioner

## 2022-12-30 ENCOUNTER — Ambulatory Visit (INDEPENDENT_AMBULATORY_CARE_PROVIDER_SITE_OTHER): Payer: Medicaid Other

## 2022-12-30 ENCOUNTER — Encounter (INDEPENDENT_AMBULATORY_CARE_PROVIDER_SITE_OTHER): Payer: Self-pay | Admitting: Nurse Practitioner

## 2022-12-30 VITALS — BP 131/92 | HR 75 | Ht 70.0 in | Wt 235.0 lb

## 2022-12-30 DIAGNOSIS — I83001 Varicose veins of unspecified lower extremity with ulcer of thigh: Secondary | ICD-10-CM | POA: Diagnosis not present

## 2022-12-30 DIAGNOSIS — I739 Peripheral vascular disease, unspecified: Secondary | ICD-10-CM | POA: Diagnosis not present

## 2022-12-30 DIAGNOSIS — R609 Edema, unspecified: Secondary | ICD-10-CM | POA: Diagnosis not present

## 2022-12-30 DIAGNOSIS — I83812 Varicose veins of left lower extremities with pain: Secondary | ICD-10-CM

## 2022-12-31 ENCOUNTER — Other Ambulatory Visit (INDEPENDENT_AMBULATORY_CARE_PROVIDER_SITE_OTHER): Payer: Self-pay | Admitting: Nurse Practitioner

## 2022-12-31 ENCOUNTER — Ambulatory Visit (INDEPENDENT_AMBULATORY_CARE_PROVIDER_SITE_OTHER): Payer: Medicaid Other | Admitting: Vascular Surgery

## 2022-12-31 ENCOUNTER — Ambulatory Visit (INDEPENDENT_AMBULATORY_CARE_PROVIDER_SITE_OTHER): Payer: Medicaid Other

## 2022-12-31 VITALS — BP 131/91 | HR 75 | Ht 70.0 in | Wt 235.0 lb

## 2022-12-31 DIAGNOSIS — M79604 Pain in right leg: Secondary | ICD-10-CM

## 2022-12-31 DIAGNOSIS — M79606 Pain in leg, unspecified: Secondary | ICD-10-CM

## 2022-12-31 DIAGNOSIS — M79605 Pain in left leg: Secondary | ICD-10-CM

## 2022-12-31 DIAGNOSIS — I872 Venous insufficiency (chronic) (peripheral): Secondary | ICD-10-CM

## 2022-12-31 DIAGNOSIS — M47817 Spondylosis without myelopathy or radiculopathy, lumbosacral region: Secondary | ICD-10-CM | POA: Diagnosis not present
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
## 2023-01-01 ENCOUNTER — Encounter (INDEPENDENT_AMBULATORY_CARE_PROVIDER_SITE_OTHER): Payer: Self-pay | Admitting: Vascular Surgery

## 2023-01-01 DIAGNOSIS — M47817 Spondylosis without myelopathy or radiculopathy, lumbosacral region: Secondary | ICD-10-CM | POA: Insufficient documentation

## 2023-01-01 DIAGNOSIS — M79606 Pain in leg, unspecified: Secondary | ICD-10-CM | POA: Insufficient documentation

## 2023-01-01 DIAGNOSIS — I872 Venous insufficiency (chronic) (peripheral): Secondary | ICD-10-CM | POA: Insufficient documentation

## 2023-01-07 LAB — VAS US ABI WITH/WO TBI
Left ABI: 1.06
Right ABI: 1.03

## 2023-01-11 ENCOUNTER — Encounter (INDEPENDENT_AMBULATORY_CARE_PROVIDER_SITE_OTHER): Payer: Self-pay | Admitting: Nurse Practitioner
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
## 2023-04-27 ENCOUNTER — Other Ambulatory Visit: Payer: Self-pay

## 2023-04-27 ENCOUNTER — Emergency Department
Admission: EM | Admit: 2023-04-27 | Discharge: 2023-04-27 | Disposition: A | Discharge status: Home / Self Care | Payer: Medicaid Other | Attending: Emergency Medicine | Admitting: Emergency Medicine

## 2023-04-27 ENCOUNTER — Emergency Department: Payer: Medicaid Other

## 2023-04-27 ENCOUNTER — Encounter: Payer: Self-pay | Admitting: Emergency Medicine

## 2023-04-27 DIAGNOSIS — I509 Heart failure, unspecified: Secondary | ICD-10-CM | POA: Diagnosis not present

## 2023-04-27 DIAGNOSIS — R1031 Right lower quadrant pain: Secondary | ICD-10-CM | POA: Insufficient documentation

## 2023-04-27 DIAGNOSIS — J449 Chronic obstructive pulmonary disease, unspecified: Secondary | ICD-10-CM | POA: Insufficient documentation

## 2023-04-27 LAB — COMPREHENSIVE METABOLIC PANEL
ALT: 82 U/L — ABNORMAL HIGH (ref 0–44)
AST: 83 U/L — ABNORMAL HIGH (ref 15–41)
Albumin: 3.4 g/dL — ABNORMAL LOW (ref 3.5–5.0)
Alkaline Phosphatase: 88 U/L (ref 38–126)
Anion gap: 5 (ref 5–15)
BUN: 9 mg/dL (ref 6–20)
CO2: 31 mmol/L (ref 22–32)
Calcium: 8.4 mg/dL — ABNORMAL LOW (ref 8.9–10.3)
Chloride: 100 mmol/L (ref 98–111)
Creatinine, Ser: 0.88 mg/dL (ref 0.61–1.24)
GFR, Estimated: >60 mL/min (ref >60)
Glucose, Bld: 91 mg/dL (ref 70–99)
Potassium: 3.8 mmol/L (ref 3.5–5.1)
Sodium: 136 mmol/L (ref 135–145)
Total Bilirubin: 0.7 mg/dL (ref 0.3–1.2)
Total Protein: 7.4 g/dL (ref 6.5–8.1)

## 2023-04-27 LAB — CBC
HCT: 39.5 % (ref 39.0–52.0)
Hemoglobin: 12.7 g/dL — ABNORMAL LOW (ref 13.0–17.0)
MCH: 30.5 pg (ref 26.0–34.0)
MCHC: 32.2 g/dL (ref 30.0–36.0)
MCV: 94.7 fL (ref 80.0–100.0)
Platelets: 232 10*3/uL (ref 150–400)
RBC: 4.17 MIL/uL — ABNORMAL LOW (ref 4.22–5.81)
RDW: 13.2 % (ref 11.5–15.5)
WBC: 5.8 10*3/uL (ref 4.0–10.5)
nRBC: 0 % (ref 0.0–0.2)

## 2023-04-27 LAB — LIPASE, BLOOD: Lipase: 30 U/L (ref 11–51)

## 2023-04-27 LAB — URINALYSIS, ROUTINE W REFLEX MICROSCOPIC
Bilirubin Urine: NEGATIVE
Glucose, UA: NEGATIVE mg/dL
Hgb urine dipstick: NEGATIVE
Ketones, ur: NEGATIVE mg/dL
Leukocytes,Ua: NEGATIVE
Nitrite: NEGATIVE
Protein, ur: NEGATIVE mg/dL
Specific Gravity, Urine: 1.008 (ref 1.005–1.030)
pH: 8 (ref 5.0–8.0)

## 2023-04-27 MED ORDER — IOHEXOL 300 MG/ML  SOLN
100.0000 mL | Freq: Once | INTRAMUSCULAR | Status: AC | PRN
  Administered 2023-04-27: 100 mL via INTRAVENOUS

## 2023-04-27 NOTE — ED Triage Notes (Signed)
Pt here with RLQ abd pain that started 3 days. Pt states the pain is constant but goes away when he lies down but returns when he gets up. Pt states he still has his appendix. Pt denies NVD.

## 2023-04-27 NOTE — ED Provider Notes (Signed)
[facility] Provider Note    Event Date/Time   First MD Initiated Contact with Patient 04/27/23 1152     (approximate)   History   Chief Complaint Abdominal Pain   HPI  Aaron Young is a 42 y.o. male with past medical history of COPD, CHF, and GERD presents to the ED complaining of abdominal pain.  Patient ports that he has had 2 days of constant pain in the right lower quadrant of his abdomen.  He describes pain as sharp and worse with certain changes in position.  He has not had any fevers, nausea, vomiting, or diarrhea, but does endorse recent constipation with no bowel movements in the past 2 days.  He has not had any dysuria, hematuria, or flank pain.  He denies any history of similar symptoms and has never had surgery on his abdomen.     Physical Exam   Triage Vital Signs: ED Triage Vitals  Enc Vitals Group     BP 04/27/23 1050 (!) 149/96     Pulse Rate 04/27/23 1050 79     Resp 04/27/23 1050 18     Temp 04/27/23 1050 98.2 F (36.8 C)     Temp Source 04/27/23 1050 Oral     SpO2 04/27/23 1050 100 %     Weight 04/27/23 1051 235 lb 0.2 oz (106.6 kg)     Height 04/27/23 1051 5\' 10"  (1.778 m)     Head Circumference --      Peak Flow --      Pain Score 04/27/23 1051 7     Pain Loc --      Pain Edu? --      Excl. in GC? --     Most recent vital signs: Vitals:   04/27/23 1050  BP: (!) 149/96  Pulse: 79  Resp: 18  Temp: 98.2 F (36.8 C)  SpO2: 100%    Constitutional: Alert and oriented. Eyes: Conjunctivae are normal. Head: Atraumatic. Nose: No congestion/rhinnorhea. Mouth/Throat: Mucous membranes are moist.  Cardiovascular: Normal rate, regular rhythm. Grossly normal heart sounds.  2+ radial pulses bilaterally. Respiratory: Normal respiratory effort.  No retractions. Lungs CTAB. Gastrointestinal: Soft and tender to palpation in the right lower quadrant with no rebound or guarding. No distention. Musculoskeletal: No lower  extremity tenderness nor edema.  Neurologic:  Normal speech and language. No gross focal neurologic deficits are appreciated.    ED Results / Procedures / Treatments   Labs (all labs ordered are listed, but only abnormal results are displayed) Labs Reviewed  COMPREHENSIVE METABOLIC PANEL - Abnormal; Notable for the following components:      Result Value   Calcium 8.4 (*)    Albumin 3.4 (*)    AST 83 (*)    ALT 82 (*)    All other components within normal limits  CBC - Abnormal; Notable for the following components:   RBC 4.17 (*)    Hemoglobin 12.7 (*)    All other components within normal limits  URINALYSIS, ROUTINE W REFLEX MICROSCOPIC - Abnormal; Notable for the following components:   Color, Urine YELLOW (*)    APPearance CLEAR (*)    All other components within normal limits  LIPASE, BLOOD   RADIOLOGY CT abdomen/pelvis reviewed and interpreted by me with no inflammatory changes, focal fluid collections, or dilated bowel loops.  PROCEDURES:  Critical Care performed: No  Procedures   MEDICATIONS ORDERED IN ED: Medications  iohexol (OMNIPAQUE) 300 MG/ML solution 100 mL (100  mLs Intravenous Contrast Given 04/27/23 1403)     IMPRESSION / MDM / ASSESSMENT AND PLAN / ED COURSE  I reviewed the triage vital signs and the nursing notes.                              42 y.o. male with past medical history of COPD, CHF, and GERD who presents to the ED with 2 days of increasing pain in the right lower quadrant of his abdomen associated with constipation.  Patient's presentation is most consistent with acute presentation with potential threat to life or bodily function.  Differential diagnosis includes, but is not limited to, appendicitis, kidney stone, UTI, bowel obstruction.  Patient well-appearing and in no acute distress, vital signs are unremarkable.  Patient's abdomen is soft but he has significant tenderness in the right lower quadrant and we will further assess  with CT imaging for appendicitis.  Labs and urinalysis are pending at this time, patient declines pain or nausea medication.  CT imaging negative for appendicitis or other acute process.  Labs without significant anemia, leukocytosis, tract abnormality, or AKI.  Patient does have a mild transaminitis that appears chronic and similar to previous, no elevation in bilirubin or alkaline phosphatase, lipase within normal limits.  Urinalysis shows no signs of infection, no apparent emergent cause for patient's abdominal pain.  Pain is well-controlled on reassessment and he is appropriate for discharge home with GI/PCP follow-up.  He was counseled to return to the ED for new or worsening symptoms, patient agrees with plan.      FINAL CLINICAL IMPRESSION(S) / ED DIAGNOSES   Final diagnoses:  RLQ abdominal pain     Rx / DC Orders   ED Discharge Orders     None        Note:  This document was prepared using Dragon voice recognition software and may include unintentional dictation errors.   Chesley Noon, MD 04/27/23 [phone]

## 2023-04-27 NOTE — ED Notes (Signed)
See triage note  Presents with some abd pain  No fever some nausea

## 2023-04-27 NOTE — ED Notes (Signed)
Went in to draw blood and start IV  he refused  Requesting IV team consult

## 2023-10-21 ENCOUNTER — Encounter (INDEPENDENT_AMBULATORY_CARE_PROVIDER_SITE_OTHER): Payer: Self-pay | Admitting: Nurse Practitioner

## 2023-10-21 ENCOUNTER — Ambulatory Visit (INDEPENDENT_AMBULATORY_CARE_PROVIDER_SITE_OTHER): Payer: MEDICAID | Admitting: Nurse Practitioner

## 2023-10-21 VITALS — BP 134/82 | HR 85 | Resp 16 | Wt 234.0 lb

## 2023-10-21 DIAGNOSIS — I83813 Varicose veins of bilateral lower extremities with pain: Secondary | ICD-10-CM | POA: Diagnosis not present

## 2023-10-21 DIAGNOSIS — L989 Disorder of the skin and subcutaneous tissue, unspecified: Secondary | ICD-10-CM

## 2023-10-22 ENCOUNTER — Encounter (INDEPENDENT_AMBULATORY_CARE_PROVIDER_SITE_OTHER): Payer: Self-pay | Admitting: Nurse Practitioner
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
## 2023-12-16 ENCOUNTER — Encounter: Payer: Self-pay | Admitting: Dermatology

## 2023-12-16 ENCOUNTER — Ambulatory Visit: Payer: MEDICAID | Admitting: Dermatology

## 2023-12-16 DIAGNOSIS — L281 Prurigo nodularis: Secondary | ICD-10-CM | POA: Diagnosis not present

## 2023-12-16 DIAGNOSIS — S50812A Abrasion of left forearm, initial encounter: Secondary | ICD-10-CM | POA: Diagnosis not present

## 2023-12-16 DIAGNOSIS — S50811A Abrasion of right forearm, initial encounter: Secondary | ICD-10-CM | POA: Diagnosis not present

## 2023-12-16 DIAGNOSIS — Z808 Family history of malignant neoplasm of other organs or systems: Secondary | ICD-10-CM

## 2023-12-16 DIAGNOSIS — T148XXA Other injury of unspecified body region, initial encounter: Secondary | ICD-10-CM

## 2023-12-16 DIAGNOSIS — L280 Lichen simplex chronicus: Secondary | ICD-10-CM | POA: Diagnosis not present

## 2023-12-16 MED ORDER — TRIAMCINOLONE ACETONIDE 0.1 % EX OINT: 1.0000 | TOPICAL_OINTMENT | Freq: Two times a day (BID) | CUTANEOUS | 5 refills | Status: AC

## 2023-12-16 NOTE — Patient Instructions (Signed)

## 2023-12-16 NOTE — Progress Notes (Signed)
   New Patient Visit   Subjective  Aaron Young is a 43 y.o. male who presents for the following: hx of boils all over 1 yr ago,  resolved after taking oral anti fungal, then R>L arm started flaring 8 months ago, painful, pt states bumps get white spots and if he does not get white spot out area gets thick and hard, no hx of skin cancer, fhx of skin cancer not sure type. Reports itch/discomfort from lesions spreads throughout body  New patient referral from Sheppard Plumber, NP at [facility]  The following portions of the chart were reviewed this encounter and updated as appropriate: medications, allergies, medical history  Review of Systems:  No other skin or systemic complaints except as noted in HPI or Assessment and Plan.  Objective  Well appearing patient in no apparent distress; mood and affect are within normal limits.   A focused examination was performed of the following areas: arms  Relevant exam findings are noted in the Assessment and Plan.       Assessment & Plan   FAMILY HISTORY OF SKIN CANCER What type(s):not sure Who affected:mother    PRURIGO NODULARIS/LICHEN SIMPLEX CHRONICUS Bil arms Exam: excoriated indurated hyperpigmented geometric plaques R > L forearm  Chronic and persistent condition with duration or expected duration over one year. Condition is bothersome/symptomatic for patient. Currently flared.   Lichen simplex chronicus [facility]) is a persistent itchy area of thickened skin that is induced by chronic rubbing and/or scratching (chronic dermatitis).  These areas may be pink, hyperpigmented and may have excoriations and bumps (prurigo nodules-PN).  PN/LSC is commonly observed in uncontrolled atopic dermatitis and other forms of eczema, and in other itchy skin conditions (eg, insect bites, scabies).  Sometimes it is not possible to know initial cause of PN/LSC if it has been present for a long time.  It generally responds well  to treatment with high potency topical steroids.  It is important to stop rubbing/scratching the area in order to break the itch-scratch-rash-itch cycle, in order for the rash to resolve.   Treatment Plan: Start TMC 0.1% oint bid to aa arms until healed/smooth, avoid f/g/a  Topical steroids (such as triamcinolone, fluocinolone, fluocinonide, mometasone, clobetasol, halobetasol, betamethasone, hydrocortisone) can cause thinning and lightening of the skin if they are used for too long in the same area. Your physician has selected the right strength medicine for your problem and area affected on the body. Please use your medication only as directed by your physician to prevent side effects.   Avoid picking/rubbing/scratching  PRURIGO NODULARIS   EXCORIATION    Return if symptoms worsen or fail to improve.  I, Ardis Rowan, RMA, am acting as scribe for Elie Goody, MD .   Documentation: I have reviewed the above documentation for accuracy and completeness, and I agree with the above.  Elie Goody, MD

## 2024-01-05 ENCOUNTER — Encounter: Payer: Self-pay | Admitting: Dermatology

## 2024-01-05 ENCOUNTER — Telehealth: Payer: Self-pay

## 2024-01-05 NOTE — Telephone Encounter (Signed)
Patient called to let Dr Katrinka Blazing know Triamcinolone ointment he is using on his arm is not helping, pt  think he is getting an infection from the ointment,  I asked patient to please upload a photo on my chart and send it to [place]. He will send a photo today

## 2024-01-06 ENCOUNTER — Other Ambulatory Visit: Payer: Self-pay | Admitting: Dermatology

## 2024-01-06 MED ORDER — MUPIROCIN 2 % EX OINT: 1.0000 | TOPICAL_OINTMENT | Freq: Two times a day (BID) | CUTANEOUS | 0 refills | Status: AC

## 2024-01-20 ENCOUNTER — Ambulatory Visit (INDEPENDENT_AMBULATORY_CARE_PROVIDER_SITE_OTHER): Payer: MEDICAID | Admitting: Dermatology

## 2024-01-20 DIAGNOSIS — L281 Prurigo nodularis: Secondary | ICD-10-CM

## 2024-01-20 DIAGNOSIS — D492 Neoplasm of unspecified behavior of bone, soft tissue, and skin: Secondary | ICD-10-CM

## 2024-01-20 DIAGNOSIS — D489 Neoplasm of uncertain behavior, unspecified: Secondary | ICD-10-CM

## 2024-01-20 DIAGNOSIS — R21 Rash and other nonspecific skin eruption: Secondary | ICD-10-CM | POA: Diagnosis not present

## 2024-01-20 DIAGNOSIS — Z7189 Other specified counseling: Secondary | ICD-10-CM

## 2024-01-20 DIAGNOSIS — L280 Lichen simplex chronicus: Secondary | ICD-10-CM | POA: Diagnosis not present

## 2024-01-20 DIAGNOSIS — L720 Epidermal cyst: Secondary | ICD-10-CM | POA: Diagnosis not present

## 2024-01-20 NOTE — Patient Instructions (Addendum)
Biopsy Wound Care Instructions  Leave the original bandage on for 24 hours if possible.  If the bandage becomes soaked or soiled before that time, it is OK to remove it and examine the wound.  A small amount of post-operative bleeding is normal.  If excessive bleeding occurs, remove the bandage, place gauze over the site and apply continuous pressure (no peeking) over the area for 30 minutes. If this does not work, please call our clinic as soon as possible or page your doctor if it is after hours.   Once a day, cleanse the wound with soap and water. It is fine to shower. If a thick crust develops you may use a Q-tip dipped into dilute hydrogen peroxide (mix 1:1 with water) to dissolve it.  Hydrogen peroxide can slow the healing process, so use it only as needed.    After washing, apply petroleum jelly (Vaseline) or an antibiotic ointment if your doctor prescribed one for you, followed by a bandage.    For best healing, the wound should be covered with a layer of ointment at all times. If you are not able to keep the area covered with a bandage to hold the ointment in place, this may mean re-applying the ointment several times a day.  Continue this wound care until the wound has healed and is no longer open.   Itching and mild discomfort is normal during the healing process. However, if you develop pain or severe itching, please call our office.   If you have any discomfort, you can take Tylenol (acetaminophen) or ibuprofen as directed on the bottle. (Please do not take these if you have an allergy to them or cannot take them for another reason).  Some redness, tenderness and white or yellow material in the wound is normal healing.  If the area becomes very sore and red, or develops a thick yellow-green material (pus), it may be infected; please notify us.    If you have stitches, return to clinic as directed to have the stitches removed. You will continue wound care for 2-3 days after the stitches  are removed.   Wound healing continues for up to one year following surgery. It is not unusual to experience pain in the scar from time to time during the interval.  If the pain becomes severe or the scar thickens, you should notify the office.    A slight amount of redness in a scar is expected for the first six months.  After six months, the redness will fade and the scar will soften and fade.  The color difference becomes less noticeable with time.  If there are any problems, return for a post-op surgery check at your earliest convenience.  To improve the appearance of the scar, you can use silicone scar gel, cream, or sheets (such as Mederma or Serica) every night for up to one year. These are available over the counter (without a prescription).  Please call our office at [phone] for any questions or concerns.      Due to recent changes in healthcare laws, you may see results of your pathology and/or laboratory studies on MyChart before the doctors have had a chance to review them. We understand that in some cases there may be results that are confusing or concerning to you. Please understand that not all results are received at the same time and often the doctors may need to interpret multiple results in order to provide you with the best plan of care or  course of treatment. Therefore, we ask that you please give [place] 2 business days to thoroughly review all your results before contacting the office for clarification. Should we see a critical lab result, you will be contacted sooner.   If You Need Anything After Your Visit  If you have any questions or concerns for your doctor, please call our main line at [phone] and press option 4 to reach your doctor's medical assistant. If no one answers, please leave a voicemail as directed and we will return your call as soon as possible. Messages left after 4 pm will be answered the following business day.   You may also send [place] a message  via MyChart. We typically respond to MyChart messages within 1-2 business days.  For prescription refills, please ask your pharmacy to contact our office. Our fax number is [phone].  If you have an urgent issue when the clinic is closed that cannot wait until the next business day, you can page your doctor at the number below.    Please note that while we do our best to be available for urgent issues outside of office hours, we are not available 24/7.   If you have an urgent issue and are unable to reach [place], you may choose to seek medical care at your doctor's office, retail clinic, urgent care center, or emergency room.  If you have a medical emergency, please immediately call 911 or go to the emergency department.  Pager Numbers  - Dr. Gwen Pounds: [phone]  - Dr. Roseanne Reno: [phone]  - Dr. Katrinka Blazing: [phone]   In the event of inclement weather, please call our main line at [phone] for an update on the status of any delays or closures.  Dermatology Medication Tips: Please keep the boxes that topical medications come in in order to help keep track of the instructions about where and how to use these. Pharmacies typically print the medication instructions only on the boxes and not directly on the medication tubes.   If your medication is too expensive, please contact our office at [phone] option 4 or send [place] a message through MyChart.   We are unable to tell what your co-pay for medications will be in advance as this is different depending on your insurance coverage. However, we may be able to find a substitute medication at lower cost or fill out paperwork to get insurance to cover a needed medication.   If a prior authorization is required to get your medication covered by your insurance company, please allow [place] 1-2 business days to complete this process.  Drug prices often vary depending on where the prescription is filled and some pharmacies may offer cheaper  prices.  The website www.goodrx.com contains coupons for medications through different pharmacies. The prices here do not account for what the cost may be with help from insurance (it may be cheaper with your insurance), but the website can give you the price if you did not use any insurance.  - You can print the associated coupon and take it with your prescription to the pharmacy.  - You may also stop by our office during regular business hours and pick up a GoodRx coupon card.  - If you need your prescription sent electronically to a different pharmacy, notify our office through [facility] or by phone at [phone] option 4.     Si Usted Necesita Algo Despus de Su Visita  Tambin puede enviarnos un mensaje a travs de Clinical cytogeneticist. Por lo general respondemos  a los mensajes de MyChart en el transcurso de 1 a 2 das hbiles.  Para renovar recetas, por favor pida a su farmacia que se ponga en contacto con nuestra oficina. Annie Sable de fax es [place] [phone].  Si tiene un asunto urgente cuando la clnica est cerrada y que no puede esperar hasta el siguiente da hbil, puede llamar/localizar a su doctor(a) al nmero que aparece a continuacin.   Por favor, tenga en cuenta que aunque hacemos todo lo posible para estar disponibles para asuntos urgentes fuera del horario de [place], no estamos disponibles las 24 horas del da, los 7 [address]  [address] de la [place].   Si tiene un problema urgente y no puede comunicarse con nosotros, puede optar por buscar atencin mdica  en el consultorio de su doctor(a), en una clnica privada, en un centro de atencin urgente o en una sala de emergencias.  Si tiene Engineer, drilling, por favor llame inmediatamente al 911 o vaya a la sala de emergencias.  Nmeros de bper  - Dr. Gwen Pounds: [phone]  - Dra. Roseanne Reno: [phone]  - Dr. Katrinka Blazing: [phone]   En caso de inclemencias del tiempo, por favor llame a Lacy Duverney principal al [phone]  para una actualizacin sobre el [place] de cualquier retraso o cierre.  Consejos para la medicacin en dermatologa: Por favor, guarde las cajas en las que vienen los medicamentos de uso tpico para ayudarle a seguir las instrucciones sobre dnde y cmo usarlos. Las farmacias generalmente imprimen las instrucciones del medicamento slo en las cajas y no directamente en los tubos del [place].   Si su medicamento es muy caro, por favor, pngase en contacto con Rolm Gala llamando al [phone] y presione la opcin 4 o envenos un mensaje a travs de Clinical cytogeneticist.   No podemos decirle cul ser su copago por los medicamentos por adelantado ya que esto es diferente dependiendo de la cobertura de su seguro. Sin embargo, es posible que podamos encontrar un medicamento sustituto a Audiological scientist un formulario para que el seguro cubra el medicamento que se considera necesario.   Si se requiere una autorizacin previa para que su compaa de seguros [place] su medicamento, por favor permtanos de 1 a 2 das hbiles para completar [address]  Los precios de los medicamentos varan con frecuencia dependiendo del Environmental consultant de dnde se surte la receta y alguna farmacias pueden ofrecer precios ms baratos.  El sitio web www.goodrx.com tiene cupones para medicamentos de Health and safety inspector. Los precios aqu no tienen en cuenta lo que podra costar con la ayuda del seguro (puede ser ms barato con su seguro), pero el sitio web puede darle el precio si no utiliz Tourist information centre manager.  - Puede imprimir el cupn correspondiente y llevarlo con su receta a la farmacia.  - Tambin puede pasar por nuestra oficina durante el horario de atencin regular y Education officer, museum una tarjeta de cupones de GoodRx.  - Si necesita que su receta se enve electrnicamente a una farmacia diferente, informe a nuestra oficina a travs de MyChart de [place] o por telfono llamando al [phone] y presione la opcin 4.

## 2024-01-20 NOTE — Progress Notes (Signed)
 Follow-Up Visit   Subjective  Aaron Young is a 43 y.o. male who presents for the following:  patient here today for follow up on rash at arms. States this has been ongoing for years. Attributes could be caused by iv drug use history.  Seen by Dr. Katrinka Blazing in January 12/16/2023 given rx of triamcinolone cream and mupirocin. Patient reports neither cream helped. 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  He felt the oral fluconazole immediately improved his condition and seemed to heal up his arms.  He has continued to take it and is now on 200 mg half a pill twice a day as he is running out.   Patient states he seen significant improvement.   Patient reports he restarted fluconazole 200 mg 1/2 pill at in am and 1/2 pill in pm. Patient states he feels he is still improving.   Patient says he gets boils all over his body he also has little white marks on his skin that he picks out.  If he does not pick them out they become boils. He has history of IV drug abuse, but currently says he is not using drugs.  The patient has spots, moles and lesions to be evaluated, some may be new or changing and the patient may have concern these could be cancer.  The following portions of the chart were reviewed this encounter and updated as appropriate: medications, allergies, medical history  Review of Systems:  No other skin or systemic complaints except as noted in HPI or Assessment and Plan.  Objective  Well appearing patient in no apparent distress; mood and affect are within normal limits.  Upper body skin exam performed.  Relevant exam findings are noted in the Assessment and Plan.          right forearm proximal Thickened area with central white area  right forearm distal Hyperkeratotic crusted papule   Assessment & Plan   PRURIGO NODULARIS/LICHEN SIMPLEX CHRONICUS excoriation and secondary  infection recent with flare  Vs  Other infectious condition and /or Hidradenitis (patient is most concerned about chromoblastomycosis )  (Pt may have soe element of Delusions of Parasitosis)  The recent flare has improved since Feb 5 Patient stopped mupirocin and TMC, and tmc states did not help  Bil arms Exam: excoriated indurated hyperpigmented geometric crusted plaques with scarring R > L forearm see photos   Chronic and persistent condition with duration or expected duration over one year. Condition is bothersome/symptomatic for patient. Currently flared. Lichen simplex chronicus [facility]) is a persistent itchy area of thickened skin that is induced by chronic rubbing and/or scratching (chronic dermatitis).  These areas may be pink, hyperpigmented and may have excoriations and bumps (prurigo nodules-PN).  PN/LSC is commonly observed in uncontrolled atopic dermatitis and other forms of eczema, and in other itchy skin conditions (eg, insect bites, scabies).  Sometimes it is not possible to know initial cause of PN/LSC if it has been present for a long time.  It generally responds well to treatment with high potency topical steroids.  It is important to stop rubbing/scratching the area in order to break the itch-scratch-rash-itch cycle, in order for the rash to resolve.    Treatment Plan: Bx's today  Discussed may consider Nemluvio pending results. Nemluvio recommend once monthly   Rash  Infectious Abscesses vs Folliculitis vs Hidradenitis  Currently clear  Recheck at follow up Patient reports  rash improved while on Fluconazole  Exam: Patient shows photos on phone "boils on skin   Treatment Plan: Patient will send photos from phone to mychart and we will save to  Can continue fluconazole 1/2 by mouth once daily   NEOPLASM OF UNCERTAIN BEHAVIOR (2) right forearm proximal Skin / nail biopsy Type of biopsy: tangential   Informed consent: discussed and consent obtained   Timeout: patient  name, date of birth, surgical site, and procedure verified   Procedure prep:  Patient was prepped and draped in usual sterile fashion Prep type:  Isopropyl alcohol Anesthesia: the lesion was anesthetized in a standard fashion   Anesthetic:  1% lidocaine w/ epinephrine 1-100,000 buffered w/ 8.4% NaHCO3 Instrument used: flexible razor blade   Hemostasis achieved with: pressure, aluminum chloride and electrodesiccation   Outcome: patient tolerated procedure well   Post-procedure details: sterile dressing applied and wound care instructions given   Dressing type: petrolatum and bandage   Specimen 1 - Surgical pathology Differential Diagnosis: R/o Prurigo nodularis vs lichen simplex chronicus vs infectious etiology vs Chromoblastomycosis   Check Margins: No right forearm distal Skin / nail biopsy Type of biopsy: tangential   Informed consent: discussed and consent obtained   Timeout: patient name, date of birth, surgical site, and procedure verified   Procedure prep:  Patient was prepped and draped in usual sterile fashion Prep type:  Isopropyl alcohol Anesthesia: the lesion was anesthetized in a standard fashion   Anesthetic:  1% lidocaine w/ epinephrine 1-100,000 buffered w/ 8.4% NaHCO3 Instrument used: flexible razor blade   Hemostasis achieved with: pressure, aluminum chloride and electrodesiccation   Outcome: patient tolerated procedure well   Post-procedure details: sterile dressing applied and wound care instructions given   Dressing type: petrolatum and bandage   Specimen 2 - Surgical pathology Differential Diagnosis: R/o Prurigo nodularis vs lichen simplex chronicus vs infectious etiology vs Chromoblastomycosis   Check Margins: No R/o Prurigo nodularis vs lichen simplex chronicus vs infectious etiology vs Chromoblastomycosis   Return for Follow up pending pathology results.  IAsher Muir, CMA, am acting as scribe for Armida Sans, MD.   Documentation: I have reviewed  the above documentation for accuracy and completeness, and I agree with the above.  Armida Sans, MD

## 2024-01-24 ENCOUNTER — Telehealth: Payer: Self-pay

## 2024-01-24 ENCOUNTER — Encounter: Payer: Self-pay | Admitting: Dermatology

## 2024-01-24 LAB — SURGICAL PATHOLOGY

## 2024-01-24 NOTE — Telephone Encounter (Addendum)
 Tried calling patient regarding bx results. Patient was unavailable. Will try again tomorrow.    ----- Message from Armida Sans sent at 01/24/2024  4:04 PM EST ----- FINAL DIAGNOSIS        1. Skin, right forearm proximal :       LICHEN SIMPLEX CHRONICUS        2. Skin, right forearm distal :       PRURIGO NODULARIS OVERLYING EPIDERMOID CYST   1&2 - both show Lichen Simples Chronicus /prurigo nodularis changes the cyst underlying the process on the distal biopsy. PAS stain shows no evidence of fungus. This is consistent with what Dr. Katrinka Blazing and I both suspected in this patient. He may benefit from medication such as the Nemluvia vs Dupixent Please ask pt if he is interested in these meds.

## 2024-01-31 ENCOUNTER — Telehealth: Payer: Self-pay

## 2024-01-31 NOTE — Telephone Encounter (Signed)
Error

## 2024-01-31 NOTE — Telephone Encounter (Signed)
 I returned patient's call and discussed pathology results with him. Patient wanted to know if there were other treatment options other than the Dupixent and Nemluvio, because he is concerned with how long the medication stays in the body after the injection, and having an allergic reaction. Pt states that he has other health conditions, and wanted to know if there was a pill he could take instead so if he does have a reaction he can stop it immediately. Please advise.

## 2024-01-31 NOTE — Telephone Encounter (Signed)
-----   Message from Armida Sans sent at 01/24/2024  4:04 PM EST ----- FINAL DIAGNOSIS        1. Skin, right forearm proximal :       LICHEN SIMPLEX CHRONICUS        2. Skin, right forearm distal :       PRURIGO NODULARIS OVERLYING EPIDERMOID CYST   1&2 - both show Lichen Simples Chronicus /prurigo nodularis changes the cyst underlying the process on the distal biopsy. PAS stain shows no evidence of fungus. This is consistent with what Dr. Katrinka Blazing and I both suspected in this patient. He may benefit from medication such as the Nemluvia vs Dupixent Please ask pt if he is interested in these meds.

## 2024-03-09 ENCOUNTER — Telehealth (INDEPENDENT_AMBULATORY_CARE_PROVIDER_SITE_OTHER): Payer: Self-pay | Admitting: Nurse Practitioner

## 2024-03-09 NOTE — Telephone Encounter (Signed)
 ATC pt to get insurance information to obtain a prior auth for a laser ablation. VM full and unable to LM.

## 2024-03-14 ENCOUNTER — Telehealth (INDEPENDENT_AMBULATORY_CARE_PROVIDER_SITE_OTHER): Payer: Self-pay | Admitting: Vascular Surgery

## 2024-03-14 NOTE — Telephone Encounter (Signed)
 Spoke with pt to schedule laser procedures. Pt states that he was in the middle of something and will need to call back tomorrow. I will wait for a phone call to schedule.   right leg GSV laser. see gs. no prior auth req ref# [id number].   left leg GSV laser. see gs. no prior auth re. ref #[id number]   4 and 8 week post Bilateral GSV lasers. see gs/fb   1 week post right GSV laser   1 week post left GSV laser

## 2024-04-18 ENCOUNTER — Encounter (INDEPENDENT_AMBULATORY_CARE_PROVIDER_SITE_OTHER): Payer: Self-pay
